# Patient Record
Sex: Female | Born: 1995 | Race: White | Hispanic: No | Marital: Single | Smoking: Never smoker
Health system: Southern US, Community
[De-identification: ages and names within clinical notes are randomized; demographics above are authoritative.]

## PROBLEM LIST (undated history)

## (undated) DIAGNOSIS — O210 Mild hyperemesis gravidarum: Secondary | ICD-10-CM

## (undated) DIAGNOSIS — R112 Nausea with vomiting, unspecified: Secondary | ICD-10-CM

## (undated) DIAGNOSIS — N289 Disorder of kidney and ureter, unspecified: Secondary | ICD-10-CM

## (undated) DIAGNOSIS — F1290 Cannabis use, unspecified, uncomplicated: Secondary | ICD-10-CM

## (undated) DIAGNOSIS — N200 Calculus of kidney: Secondary | ICD-10-CM

## (undated) DIAGNOSIS — B029 Zoster without complications: Secondary | ICD-10-CM

---
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
## 2019-01-29 ENCOUNTER — Encounter (HOSPITAL_BASED_OUTPATIENT_CLINIC_OR_DEPARTMENT_OTHER): Payer: Self-pay

## 2019-01-29 ENCOUNTER — Other Ambulatory Visit: Payer: Self-pay

## 2019-01-29 ENCOUNTER — Emergency Department (HOSPITAL_BASED_OUTPATIENT_CLINIC_OR_DEPARTMENT_OTHER): Payer: BC Managed Care – PPO

## 2019-01-29 ENCOUNTER — Emergency Department (HOSPITAL_BASED_OUTPATIENT_CLINIC_OR_DEPARTMENT_OTHER)
Admission: EM | Admit: 2019-01-29 | Discharge: 2019-01-29 | Disposition: A | Discharge status: Home / Self Care | Payer: BC Managed Care – PPO | Attending: Emergency Medicine | Admitting: Emergency Medicine

## 2019-01-29 DIAGNOSIS — N200 Calculus of kidney: Secondary | ICD-10-CM | POA: Insufficient documentation

## 2019-01-29 DIAGNOSIS — Z20822 Contact with and (suspected) exposure to covid-19: Secondary | ICD-10-CM | POA: Insufficient documentation

## 2019-01-29 DIAGNOSIS — R197 Diarrhea, unspecified: Secondary | ICD-10-CM | POA: Diagnosis present

## 2019-01-29 DIAGNOSIS — R1030 Lower abdominal pain, unspecified: Secondary | ICD-10-CM | POA: Diagnosis not present

## 2019-01-29 DIAGNOSIS — R112 Nausea with vomiting, unspecified: Secondary | ICD-10-CM | POA: Insufficient documentation

## 2019-01-29 HISTORY — DX: Zoster without complications: B02.9

## 2019-01-29 LAB — COMPREHENSIVE METABOLIC PANEL
ALT: 12 U/L (ref 0–44)
AST: 18 U/L (ref 15–41)
Albumin: 5.4 g/dL — ABNORMAL HIGH (ref 3.5–5.0)
Alkaline Phosphatase: 43 U/L (ref 38–126)
Anion gap: 16 — ABNORMAL HIGH (ref 5–15)
BUN: 35 mg/dL — ABNORMAL HIGH (ref 6–20)
CO2: 20 mmol/L — ABNORMAL LOW (ref 22–32)
Calcium: 10.1 mg/dL (ref 8.9–10.3)
Chloride: 100 mmol/L (ref 98–111)
Creatinine, Ser: 0.81 mg/dL (ref 0.44–1.00)
GFR calc Af Amer: >60 mL/min (ref >60)
GFR calc non Af Amer: >60 mL/min (ref >60)
Glucose, Bld: 99 mg/dL (ref 70–99)
Potassium: 3.8 mmol/L (ref 3.5–5.1)
Sodium: 136 mmol/L (ref 135–145)
Total Bilirubin: 1.4 mg/dL — ABNORMAL HIGH (ref 0.3–1.2)
Total Protein: 7.7 g/dL (ref 6.5–8.1)

## 2019-01-29 LAB — URINALYSIS, ROUTINE W REFLEX MICROSCOPIC
Glucose, UA: NEGATIVE mg/dL
Ketones, ur: >80 mg/dL — AB
Nitrite: NEGATIVE
Protein, ur: 100 mg/dL — AB
Specific Gravity, Urine: >1.03 — ABNORMAL HIGH (ref 1.005–1.030)
pH: 6 (ref 5.0–8.0)

## 2019-01-29 LAB — CBC WITH DIFFERENTIAL/PLATELET
Abs Immature Granulocytes: 0.05 10*3/uL (ref 0.00–0.07)
Basophils Absolute: 0.1 10*3/uL (ref 0.0–0.1)
Basophils Relative: 0 %
Eosinophils Absolute: 0 10*3/uL (ref 0.0–0.5)
Eosinophils Relative: 0 %
HCT: 42.4 % (ref 36.0–46.0)
Hemoglobin: 14.3 g/dL (ref 12.0–15.0)
Immature Granulocytes: 0 %
Lymphocytes Relative: 8 %
Lymphs Abs: 0.9 10*3/uL (ref 0.7–4.0)
MCH: 31.2 pg (ref 26.0–34.0)
MCHC: 33.7 g/dL (ref 30.0–36.0)
MCV: 92.6 fL (ref 80.0–100.0)
Monocytes Absolute: 0.7 10*3/uL (ref 0.1–1.0)
Monocytes Relative: 6 %
Neutro Abs: 10.1 10*3/uL — ABNORMAL HIGH (ref 1.7–7.7)
Neutrophils Relative %: 86 %
Platelets: 376 10*3/uL (ref 150–400)
RBC: 4.58 MIL/uL (ref 3.87–5.11)
RDW: 13.1 % (ref 11.5–15.5)
WBC: 11.8 10*3/uL — ABNORMAL HIGH (ref 4.0–10.5)
nRBC: 0 % (ref 0.0–0.2)

## 2019-01-29 LAB — URINALYSIS, MICROSCOPIC (REFLEX): RBC / HPF: >50 RBC/hpf (ref 0–5)

## 2019-01-29 LAB — SARS CORONAVIRUS 2 (TAT 6-24 HRS): SARS Coronavirus 2: NEGATIVE

## 2019-01-29 LAB — LIPASE, BLOOD: Lipase: 27 U/L (ref 11–51)

## 2019-01-29 LAB — PREGNANCY, URINE: Preg Test, Ur: NEGATIVE

## 2019-01-29 MED ORDER — METOCLOPRAMIDE HCL 5 MG/ML IJ SOLN
5.0000 mg | Freq: Once | INTRAMUSCULAR | Status: AC
  Administered 2019-01-29: 5 mg via INTRAVENOUS
  Filled 2019-01-29: qty 2

## 2019-01-29 MED ORDER — SODIUM CHLORIDE 0.9 % IV BOLUS (SEPSIS)
1000.0000 mL | Freq: Once | INTRAVENOUS | Status: AC
  Administered 2019-01-29: 1000 mL via INTRAVENOUS

## 2019-01-29 MED ORDER — ONDANSETRON HCL 4 MG/2ML IJ SOLN
4.0000 mg | Freq: Once | INTRAMUSCULAR | Status: AC
  Administered 2019-01-29: 4 mg via INTRAVENOUS
  Filled 2019-01-29: qty 2

## 2019-01-29 MED ORDER — KETOROLAC TROMETHAMINE 15 MG/ML IJ SOLN
15.0000 mg | Freq: Once | INTRAMUSCULAR | Status: AC
Start: 2019-01-29 — End: 2019-01-29
  Administered 2019-01-29: 15 mg via INTRAVENOUS
  Filled 2019-01-29: qty 1

## 2019-01-29 MED ORDER — ONDANSETRON HCL 4 MG PO TABS: 4.0000 mg | ORAL_TABLET | Freq: Four times a day (QID) | ORAL | 0 refills | Status: DC

## 2019-01-29 MED ORDER — PROMETHAZINE HCL 25 MG/ML IJ SOLN
INTRAMUSCULAR | Status: AC
  Filled 2019-01-29: qty 1

## 2019-01-29 MED ORDER — SODIUM CHLORIDE 0.9 % IV SOLN
1.0000 g | Freq: Once | INTRAVENOUS | Status: AC
  Administered 2019-01-29: 14:00:00 1 g via INTRAVENOUS
  Filled 2019-01-29: qty 10

## 2019-01-29 MED ORDER — PROMETHAZINE HCL 25 MG/ML IJ SOLN
6.2500 mg | Freq: Once | INTRAMUSCULAR | Status: AC
  Administered 2019-01-29: 6.25 mg via INTRAVENOUS

## 2019-01-29 MED ORDER — CEPHALEXIN 500 MG PO CAPS: 500.0000 mg | ORAL_CAPSULE | Freq: Two times a day (BID) | ORAL | 0 refills | Status: AC

## 2019-01-29 MED ORDER — SODIUM CHLORIDE 0.9 % IV SOLN: Freq: Once | INTRAVENOUS | Status: DC

## 2019-01-29 MED ORDER — TAMSULOSIN HCL 0.4 MG PO CAPS: 0.4000 mg | ORAL_CAPSULE | Freq: Every day | ORAL | 0 refills | Status: AC

## 2019-01-29 NOTE — ED Triage Notes (Signed)
Pt/o n/v/d x 3 days-NAD-steady gait

## 2019-01-29 NOTE — ED Notes (Signed)
Pt ambulated to RR unassisted 

## 2019-01-29 NOTE — ED Notes (Signed)
PO ginger ale given

## 2019-01-29 NOTE — ED Notes (Signed)
ED Provider at bedside. 

## 2019-01-29 NOTE — Discharge Instructions (Signed)
You were seen today because you are feeling unwell.  It appears that you have a kidney stone on the left side as well as a urinary tract infection.  We gave you fluids through an IV, medication for pain and nausea.  The stone is small enough that it should pass on its own.  If you have not felt better in 2 days please call the urology office that is listed on your paperwork.  If you have any new or worsening symptoms please return to the emergency department. Thank you for allowing me to care for you today. Please return to the emergency department if you have new or worsening symptoms. Take your medications as instructed.

## 2019-01-29 NOTE — ED Provider Notes (Signed)
[facility] EMERGENCY DEPARTMENT Provider Note   CSN: [id number] Arrival date & time: 01/29/19  1251     History Chief Complaint  Patient presents with  . Diarrhea    Tina Terry is a 24 y.o. female.  Patient is a 24 year old female with no past medical history presenting to the emergency department for 3 days of nausea, vomiting, diarrhea.  Reports occasional mild crampy abdominal pain.  Denies any dysuria, vaginal bleeding or vaginal discharge.  She reports trying to stay hydrated but is able to keep anything down due to the nausea and vomiting.  She denies any fever, cough.  She does say that she has been sneezing.  No known Covid exposure.        Past Medical History:  Diagnosis Date  . Shingles     There are no problems to display for this patient.   History reviewed. No pertinent surgical history.   OB History   No obstetric history on file.     No family history on file.  Social History   Tobacco Use  . Smoking status: Never Smoker  . Smokeless tobacco: Never Used  Substance Use Topics  . Alcohol use: Yes    Comment: occ  . Drug use: Yes    Types: Marijuana    Home Medications Prior to Admission medications   Medication Sig Start Date End Date Taking? Authorizing Provider  cephALEXin (KEFLEX) 500 MG capsule Take 1 capsule (500 mg total) by mouth 2 (two) times daily for 7 days. 01/29/19 02/05/19  Arlyn Dunning, PA-C  HYDROcodone-acetaminophen (NORCO) 5-325 MG per tablet Take 1 tablet by mouth every 4 (four) hours as needed for pain. 06/05/12   Loren Racer, MD  ondansetron (ZOFRAN) 4 MG tablet Take 1 tablet (4 mg total) by mouth every 6 (six) hours. 01/29/19   Arlyn Dunning, PA-C  tamsulosin (FLOMAX) 0.4 MG CAPS capsule Take 1 capsule (0.4 mg total) by mouth daily after supper for 5 days. 01/29/19 02/03/19  Arlyn Dunning, PA-C    Allergies    Patient has no known allergies.  Review of Systems   Review of Systems  Constitutional:  Positive for appetite change and fatigue. Negative for chills and fever.  HENT: Positive for sneezing. Negative for congestion and sore throat.   Respiratory: Negative for cough and shortness of breath.   Cardiovascular: Negative for chest pain.  Gastrointestinal: Positive for abdominal pain, diarrhea, nausea and vomiting. Negative for constipation.  Genitourinary: Negative for dysuria, flank pain, menstrual problem, pelvic pain, vaginal bleeding and vaginal discharge.  Musculoskeletal: Negative for back pain.  Skin: Negative for rash.  Neurological: Negative for dizziness, light-headedness and headaches.    Physical Exam Updated Vital Signs BP 120/83 (BP Location: Right Arm)   Pulse 80   Temp 98.8 F (37.1 C) (Oral)   Resp 14   Ht 5\' 1"  (1.549 m)   Wt 49.4 kg   LMP 12/27/2018   SpO2 100%   BMI 20.60 kg/m   Physical Exam Vitals and nursing note reviewed.  Constitutional:      General: She is not in acute distress.    Appearance: Normal appearance. She is not ill-appearing, toxic-appearing or diaphoretic.  HENT:     Head: Normocephalic.     Nose: Nose normal.     Mouth/Throat:     Mouth: Mucous membranes are moist.  Eyes:     Conjunctiva/sclera: Conjunctivae normal.  Cardiovascular:     Rate and Rhythm: Normal rate  and regular rhythm.     Pulses: Normal pulses.  Pulmonary:     Effort: Pulmonary effort is normal.     Breath sounds: Normal breath sounds.  Abdominal:     General: Abdomen is flat. Bowel sounds are normal.     Tenderness: There is no abdominal tenderness. There is no right CVA tenderness, left CVA tenderness, guarding or rebound.  Skin:    General: Skin is warm and dry.  Neurological:     General: No focal deficit present.     Mental Status: She is alert.  Psychiatric:        Mood and Affect: Mood normal.     ED Results / Procedures / Treatments   Labs (all labs ordered are listed, but only abnormal results are displayed) Labs Reviewed  CBC WITH  DIFFERENTIAL/PLATELET - Abnormal; Notable for the following components:      Result Value   WBC 11.8 (*)    Neutro Abs 10.1 (*)    All other components within normal limits  COMPREHENSIVE METABOLIC PANEL - Abnormal; Notable for the following components:   CO2 20 (*)    BUN 35 (*)    Albumin 5.4 (*)    Total Bilirubin 1.4 (*)    Anion gap 16 (*)    All other components within normal limits  URINALYSIS, ROUTINE W REFLEX MICROSCOPIC - Abnormal; Notable for the following components:   APPearance CLOUDY (*)    Specific Gravity, Urine >1.030 (*)    Hgb urine dipstick SMALL (*)    Bilirubin Urine MODERATE (*)    Ketones, ur >80 (*)    Protein, ur 100 (*)    Leukocytes,Ua TRACE (*)    All other components within normal limits  URINALYSIS, MICROSCOPIC (REFLEX) - Abnormal; Notable for the following components:   Bacteria, UA MANY (*)    All other components within normal limits  SARS CORONAVIRUS 2 (TAT 6-24 HRS)  URINE CULTURE  PREGNANCY, URINE  LIPASE, BLOOD    EKG None  Radiology CT Renal Stone Study  Result Date: 01/29/2019 CLINICAL DATA:  Hematuria EXAM: CT ABDOMEN AND PELVIS WITHOUT CONTRAST TECHNIQUE: Multidetector CT imaging of the abdomen and pelvis was performed following the standard protocol without IV contrast. COMPARISON:  None. FINDINGS: Lower chest: No acute abnormality. Hepatobiliary: 12 mm geographic area of decreased attenuation adjacent to the falciform ligament suggesting focal fatty infiltration. Liver is otherwise unremarkable. Gallbladder appears unremarkable without gallstone or biliary dilatation. Pancreas: Unremarkable. No pancreatic ductal dilatation or surrounding inflammatory changes. Spleen: Normal in size without focal abnormality. Adrenals/Urinary Tract: Adrenal glands are unremarkable. No calculus or hydronephrosis of the bilateral kidneys. There is a tiny 1-2 mm punctate calculus within the left hemipelvis (series 2, image 64) which could possibly be  located within the distal left ureter which is not well visualized at this level. The visualized portions of the bilateral ureters are nondilated. Urinary bladder is unremarkable for the degree of distension. Stomach/Bowel: Stomach is within normal limits. Appendix appears normal. No evidence of bowel wall thickening, distention, or inflammatory changes. Vascular/Lymphatic: No significant vascular findings are present. No enlarged abdominal or pelvic lymph nodes. Reproductive: Unremarkable uterus. Multiple small cysts versus follicles in the bilateral ovaries. Other: Small volume free fluid within the posterior cul-de-sac. Musculoskeletal: No acute or significant osseous findings. IMPRESSION: 1. There is a tiny 1-2 mm punctate calculus within the left hemipelvis which could possibly be located within the distal left ureter which is not well visualized at this level. Left  ureter is not dilated. Otherwise, no evidence of obstructive uropathy. 2. Small volume free fluid within the posterior cul-de-sac, which may be physiologic. 3. Otherwise, no acute abdominopelvic findings. Electronically Signed   By: Duanne Guess D.O.   On: 01/29/2019 15:40    Procedures Procedures (including critical care time)  Medications Ordered in ED Medications  ondansetron (ZOFRAN) injection 4 mg (4 mg Intravenous Given 01/29/19 1331)  sodium chloride 0.9 % bolus 1,000 mL (0 mLs Intravenous Stopped 01/29/19 1431)  promethazine (PHENERGAN) injection 6.25 mg (6.25 mg Intravenous Given 01/29/19 1415)  cefTRIAXone (ROCEPHIN) 1 g in sodium chloride 0.9 % 100 mL IVPB ( Intravenous Stopped 01/29/19 1500)  metoCLOPramide (REGLAN) injection 5 mg (5 mg Intravenous Given 01/29/19 1603)  ketorolac (TORADOL) 15 MG/ML injection 15 mg (15 mg Intravenous Given 01/29/19 1603)    ED Course  I have reviewed the triage vital signs and the nursing notes.  Pertinent labs & imaging results that were available during my care of the patient were  reviewed by me and considered in my medical decision making (see chart for details).  Clinical Course as of Jan 29 1624  Wed Jan 29, 2019  1418 Patient with n/v/d for 3 days. Denies dysuria, vaginal bleeding, vaginal discharge, pelvic pain or concern for STD. Patient with a soft and nontender abdominal exam. She does have urine concerning for hemorrhagic cystitis. Denies gross hematuria. No flank pain or CVA tenderness to suggest kidney stone. However, given her urine appearance, and continued vomiting, concern for kidney stone so will obtain stone.  Patient will be given IV antiemetic, fluids and rocephin. Will reassess   [KM]  1555 Patient does have 1-2 mm punctate calculus. She is tolerating PO. Normal kidney function. Agrees with d/c home and f/u as needed or return if new or worsening symptoms   [KM]    Clinical Course User Index [KM] Jeral Pinch   MDM Rules/Calculators/A&P                      Based on review of vitals, medical screening exam, lab work and/or imaging, there does not appear to be an acute, emergent etiology for the patient's symptoms. Counseled pt on good return precautions and encouraged both PCP and ED follow-up as needed.  Prior to discharge, I also discussed incidental imaging findings with patient in detail and advised appropriate, recommended follow-up in detail.  Clinical Impression: 1. Kidney stone     Disposition: Discharge  Prior to providing a prescription for a controlled substance, I independently reviewed the patient's recent prescription history on the [place] Controlled Substance Reporting System. The patient had no recent or regular prescriptions and was deemed appropriate for a brief, less than 3 day prescription of narcotic for acute analgesia.  This note was prepared with assistance of Conservation officer, historic buildings. Occasional wrong-word or sound-a-like substitutions may have occurred due to the inherent limitations of voice  recognition software.  Final Clinical Impression(s) / ED Diagnoses Final diagnoses:  Kidney stone    Rx / DC Orders ED Discharge Orders         Ordered    cephALEXin (KEFLEX) 500 MG capsule  2 times daily     01/29/19 1625    tamsulosin (FLOMAX) 0.4 MG CAPS capsule  Daily after supper     01/29/19 1625    ondansetron (ZOFRAN) 4 MG tablet  Every 6 hours     01/29/19 1625  Kristine Royal 01/29/19 1626    Hayden Rasmussen, MD 01/29/19 [phone]

## 2019-01-30 LAB — URINE CULTURE: Culture: NO GROWTH

## 2019-05-22 ENCOUNTER — Other Ambulatory Visit: Payer: Self-pay

## 2019-05-22 ENCOUNTER — Encounter (HOSPITAL_BASED_OUTPATIENT_CLINIC_OR_DEPARTMENT_OTHER): Payer: Self-pay

## 2019-05-22 ENCOUNTER — Emergency Department (HOSPITAL_BASED_OUTPATIENT_CLINIC_OR_DEPARTMENT_OTHER)
Admission: EM | Admit: 2019-05-22 | Discharge: 2019-05-22 | Disposition: A | Discharge status: Home / Self Care | Payer: BC Managed Care – PPO | Attending: Emergency Medicine | Admitting: Emergency Medicine

## 2019-05-22 DIAGNOSIS — R197 Diarrhea, unspecified: Secondary | ICD-10-CM

## 2019-05-22 DIAGNOSIS — N390 Urinary tract infection, site not specified: Secondary | ICD-10-CM

## 2019-05-22 DIAGNOSIS — N3000 Acute cystitis without hematuria: Secondary | ICD-10-CM | POA: Diagnosis not present

## 2019-05-22 DIAGNOSIS — R112 Nausea with vomiting, unspecified: Secondary | ICD-10-CM | POA: Diagnosis present

## 2019-05-22 LAB — URINALYSIS, MICROSCOPIC (REFLEX)

## 2019-05-22 LAB — URINALYSIS, ROUTINE W REFLEX MICROSCOPIC
Glucose, UA: NEGATIVE mg/dL
Ketones, ur: 40 mg/dL — AB
Leukocytes,Ua: NEGATIVE
Nitrite: NEGATIVE
Protein, ur: >300 mg/dL — AB
Specific Gravity, Urine: >1.03 — ABNORMAL HIGH (ref 1.005–1.030)
pH: 6 (ref 5.0–8.0)

## 2019-05-22 LAB — PREGNANCY, URINE: Preg Test, Ur: NEGATIVE

## 2019-05-22 MED ORDER — LOPERAMIDE HCL 2 MG PO CAPS
4.0000 mg | ORAL_CAPSULE | Freq: Once | ORAL | Status: AC
  Administered 2019-05-22: 4 mg via ORAL
  Filled 2019-05-22: qty 2

## 2019-05-22 MED ORDER — CEPHALEXIN 500 MG PO CAPS
500.0000 mg | ORAL_CAPSULE | Freq: Three times a day (TID) | ORAL | 0 refills | Status: DC
Start: 2019-05-22 — End: 2019-06-01

## 2019-05-22 MED ORDER — SODIUM CHLORIDE 0.9 % IV SOLN
1.0000 g | Freq: Once | INTRAVENOUS | Status: AC
  Administered 2019-05-22: 1 g via INTRAVENOUS
  Filled 2019-05-22: qty 10

## 2019-05-22 MED ORDER — ONDANSETRON HCL 4 MG/2ML IJ SOLN
INTRAMUSCULAR | Status: AC
  Filled 2019-05-22: qty 2

## 2019-05-22 MED ORDER — ONDANSETRON HCL 4 MG/2ML IJ SOLN
4.0000 mg | Freq: Once | INTRAMUSCULAR | Status: AC
Start: 2019-05-22 — End: 2019-05-22
  Administered 2019-05-22: 4 mg via INTRAVENOUS

## 2019-05-22 MED ORDER — SODIUM CHLORIDE 0.9 % IV BOLUS
1000.0000 mL | Freq: Once | INTRAVENOUS | Status: AC
  Administered 2019-05-22: 17:00:00 1000 mL via INTRAVENOUS

## 2019-05-22 MED ORDER — ONDANSETRON HCL 4 MG PO TABS
4.0000 mg | ORAL_TABLET | Freq: Four times a day (QID) | ORAL | 0 refills | Status: DC | PRN
Start: 2019-05-22 — End: 2019-06-01

## 2019-05-22 NOTE — ED Notes (Signed)
ED Provider at bedside. 

## 2019-05-22 NOTE — ED Triage Notes (Signed)
C/o n/v/d x 3 days-denies fever-pt seen at [facility] and [facility] at [facility] ED-NAD-steady gait

## 2019-05-23 LAB — URINE CULTURE: Culture: <10000 — AB

## 2019-05-26 NOTE — ED Provider Notes (Signed)
[facility] EMERGENCY DEPARTMENT Provider Note   CSN: [id number] Arrival date & time: 05/22/19  1528     History Chief Complaint  Patient presents with  . Diarrhea    Tina Terry is a 24 y.o. female.  HPI   24 year old female with a history of nausea/vomiting/diarrhea.  Onset about 3 days ago.  Diffuse crampy abdominal pain.  Really no focality.  Patient went to [facility] earlier today but left without being seen because of the weight.  No fever.  No sick contacts he is aware of.  Past Medical History:  Diagnosis Date  . Shingles     There are no problems to display for this patient.   History reviewed. No pertinent surgical history.   OB History   No obstetric history on file.     No family history on file.  Social History   Tobacco Use  . Smoking status: Never Smoker  . Smokeless tobacco: Never Used  Substance Use Topics  . Alcohol use: Yes    Comment: occ  . Drug use: Yes    Types: Marijuana    Home Medications Prior to Admission medications   Medication Sig Start Date End Date Taking? Authorizing Provider  cephALEXin (KEFLEX) 500 MG capsule Take 1 capsule (500 mg total) by mouth 3 (three) times daily. 05/22/19   Raeford Razor, MD  ondansetron (ZOFRAN) 4 MG tablet Take 1 tablet (4 mg total) by mouth every 6 (six) hours as needed for nausea or vomiting. 05/22/19   Raeford Razor, MD    Allergies    Patient has no known allergies.  Review of Systems   Review of Systems All systems reviewed and negative, other than as noted in HPI.  Physical Exam Updated Vital Signs BP 105/70 (BP Location: Right Arm)   Pulse 75   Temp 98.5 F (36.9 C) (Oral)   Resp 16   Ht 5' 1.1" (1.552 m)   Wt 48.6 kg   LMP 05/19/2019   SpO2 99%   BMI 20.17 kg/m   Physical Exam Vitals and nursing note reviewed.  Constitutional:      General: She is not in acute distress.    Appearance: She is well-developed.  HENT:     Head: Normocephalic and  atraumatic.  Eyes:     General:        Right eye: No discharge.        Left eye: No discharge.     Conjunctiva/sclera: Conjunctivae normal.  Cardiovascular:     Rate and Rhythm: Normal rate and regular rhythm.     Heart sounds: Normal heart sounds. No murmur. No friction rub. No gallop.   Pulmonary:     Effort: Pulmonary effort is normal. No respiratory distress.     Breath sounds: Normal breath sounds.  Abdominal:     General: There is no distension.     Palpations: Abdomen is soft.     Tenderness: There is no abdominal tenderness.  Musculoskeletal:        General: No tenderness.     Cervical back: Neck supple.  Skin:    General: Skin is warm and dry.  Neurological:     Mental Status: She is alert.  Psychiatric:        Behavior: Behavior normal.        Thought Content: Thought content normal.     ED Results / Procedures / Treatments   Labs (all labs ordered are listed, but only abnormal results are  displayed) Labs Reviewed  URINE CULTURE - Abnormal; Notable for the following components:      Result Value   Culture   (*)    Value: <10,000 COLONIES/mL INSIGNIFICANT GROWTH Performed at [facility], [place] [address], [place], [place] [address]    All other components within normal limits  URINALYSIS, ROUTINE W REFLEX MICROSCOPIC - Abnormal; Notable for the following components:   APPearance CLOUDY (*)    Specific Gravity, Urine >1.030 (*)    Hgb urine dipstick LARGE (*)    Bilirubin Urine MODERATE (*)    Ketones, ur 40 (*)    Protein, ur >300 (*)    All other components within normal limits  URINALYSIS, MICROSCOPIC (REFLEX) - Abnormal; Notable for the following components:   Bacteria, UA MANY (*)    All other components within normal limits  PREGNANCY, URINE    EKG None  Radiology No results found.  Procedures Procedures (including critical care time)  Medications Ordered in ED Medications  sodium chloride 0.9 % bolus 1,000 mL ( Intravenous Stopped  05/22/19 1751)  ondansetron (ZOFRAN) injection 4 mg (4 mg Intravenous Given 05/22/19 1651)  cefTRIAXone (ROCEPHIN) 1 g in sodium chloride 0.9 % 100 mL IVPB (0 g Intravenous Stopped 05/22/19 1738)  loperamide (IMODIUM) capsule 4 mg (4 mg Oral Given 05/22/19 1704)    ED Course  I have reviewed the triage vital signs and the nursing notes.  Pertinent labs & imaging results that were available during my care of the patient were reviewed by me and considered in my medical decision making (see chart for details).    MDM Rules/Calculators/A&P                      Final Clinical Impression(s) / ED Diagnoses Final diagnoses:  Nausea vomiting and diarrhea  Urinary tract infection without hematuria, site unspecified    Rx / DC Orders ED Discharge Orders         Ordered    cephALEXin (KEFLEX) 500 MG capsule  3 times daily     05/22/19 1942    ondansetron (ZOFRAN) 4 MG tablet  Every 6 hours PRN     05/22/19 1943           Virgel Manifold, MD 05/26/19 1258

## 2019-05-31 ENCOUNTER — Encounter (HOSPITAL_BASED_OUTPATIENT_CLINIC_OR_DEPARTMENT_OTHER): Payer: Self-pay | Admitting: Emergency Medicine

## 2019-05-31 ENCOUNTER — Other Ambulatory Visit: Payer: Self-pay

## 2019-05-31 DIAGNOSIS — R197 Diarrhea, unspecified: Secondary | ICD-10-CM | POA: Insufficient documentation

## 2019-05-31 DIAGNOSIS — F12188 Cannabis abuse with other cannabis-induced disorder: Secondary | ICD-10-CM | POA: Diagnosis not present

## 2019-05-31 DIAGNOSIS — E860 Dehydration: Secondary | ICD-10-CM | POA: Diagnosis not present

## 2019-05-31 DIAGNOSIS — R112 Nausea with vomiting, unspecified: Secondary | ICD-10-CM | POA: Diagnosis present

## 2019-05-31 LAB — URINALYSIS, ROUTINE W REFLEX MICROSCOPIC
Glucose, UA: NEGATIVE mg/dL
Ketones, ur: >80 mg/dL — AB
Leukocytes,Ua: NEGATIVE
Nitrite: NEGATIVE
Protein, ur: 30 mg/dL — AB
Specific Gravity, Urine: >1.03 — ABNORMAL HIGH (ref 1.005–1.030)
pH: 6 (ref 5.0–8.0)

## 2019-05-31 LAB — URINALYSIS, MICROSCOPIC (REFLEX)

## 2019-05-31 LAB — PREGNANCY, URINE: Preg Test, Ur: NEGATIVE

## 2019-05-31 MED ORDER — ONDANSETRON 4 MG PO TBDP
ORAL_TABLET | ORAL | Status: AC
  Administered 2019-05-31: 4 mg via ORAL
  Filled 2019-05-31: qty 1

## 2019-05-31 MED ORDER — ONDANSETRON 4 MG PO TBDP: 4.0000 mg | ORAL_TABLET | Freq: Once | ORAL | Status: AC

## 2019-05-31 NOTE — ED Triage Notes (Signed)
C/o N/v/d, chills and cough since Thursday. Pt seen here "for same" 05/22/19. She states she was then dx with UTI and given abx, initially seemed to recover but sx returned. Denies dysuria, hematuria or other urinary sx.

## 2019-06-01 ENCOUNTER — Telehealth (HOSPITAL_BASED_OUTPATIENT_CLINIC_OR_DEPARTMENT_OTHER): Payer: Self-pay | Admitting: Emergency Medicine

## 2019-06-01 ENCOUNTER — Emergency Department (HOSPITAL_BASED_OUTPATIENT_CLINIC_OR_DEPARTMENT_OTHER)
Admission: EM | Admit: 2019-06-01 | Discharge: 2019-06-01 | Disposition: A | Discharge status: Home / Self Care | Payer: BC Managed Care – PPO | Attending: Emergency Medicine | Admitting: Emergency Medicine

## 2019-06-01 DIAGNOSIS — E860 Dehydration: Secondary | ICD-10-CM

## 2019-06-01 DIAGNOSIS — F1290 Cannabis use, unspecified, uncomplicated: Secondary | ICD-10-CM

## 2019-06-01 HISTORY — DX: Disorder of kidney and ureter, unspecified: N28.9

## 2019-06-01 LAB — RAPID URINE DRUG SCREEN, HOSP PERFORMED
Amphetamines: NOT DETECTED
Barbiturates: NOT DETECTED
Benzodiazepines: NOT DETECTED
Cocaine: NOT DETECTED
Opiates: NOT DETECTED
Tetrahydrocannabinol: POSITIVE — AB

## 2019-06-01 MED ORDER — SODIUM CHLORIDE 0.9 % IV BOLUS
1000.0000 mL | Freq: Once | INTRAVENOUS | Status: AC
  Administered 2019-06-01: 1000 mL via INTRAVENOUS

## 2019-06-01 MED ORDER — METOCLOPRAMIDE HCL 10 MG PO TABS
10.0000 mg | ORAL_TABLET | Freq: Four times a day (QID) | ORAL | 0 refills | Status: DC | PRN
Start: 2019-06-01 — End: 2020-07-05

## 2019-06-01 MED ORDER — SODIUM CHLORIDE 0.9 % IV BOLUS: 1000.0000 mL | Freq: Once | INTRAVENOUS | Status: DC

## 2019-06-01 MED ORDER — PROMETHAZINE HCL 25 MG/ML IJ SOLN: 12.5000 mg | Freq: Once | INTRAMUSCULAR | Status: DC

## 2019-06-01 MED ORDER — HALOPERIDOL LACTATE 5 MG/ML IJ SOLN
2.0000 mg | Freq: Once | INTRAMUSCULAR | Status: AC
  Administered 2019-06-01: 2 mg via INTRAVENOUS
  Filled 2019-06-01: qty 1

## 2019-06-01 NOTE — ED Provider Notes (Signed)
[facility] Provider Note: Lowella Dell, MD, FACEP  CSN: [id number] MRN: [id number] ARRIVAL: 05/31/19 at 1924 ROOM: MH10/MH10   CHIEF COMPLAINT  Vomiting   HISTORY OF PRESENT ILLNESS  06/01/19 12:42 AM Tina Terry is a 24 y.o. female with nausea, vomiting and diarrhea for the past 3 days.  The vomiting has been severe and she feels dehydrated.  Any attempt to eat or drink causes her to vomit.  She has associated epigastric abdominal pain which she rates as a 6 out of 10, cramping in nature.  The abdominal pain is not as severe as her vomiting.  She was recently treated for urinary tract infection but denies urinary symptoms currently.  She was given Zofran 4 mg ODT prior to my evaluation without relief of her nausea.  She has vomited twice since.  She does admit to marijuana use.  She brought a hand written summary of her recent medical history:       Past Medical History:  Diagnosis Date  . Renal disorder    kidney stone  . Shingles     History reviewed. No pertinent surgical history.  No family history on file.  Social History   Tobacco Use  . Smoking status: Never Smoker  . Smokeless tobacco: Never Used  Substance Use Topics  . Alcohol use: Yes    Comment: occ  . Drug use: Yes    Types: Marijuana    Comment: every day smoker    Prior to Admission medications   Medication Sig Start Date End Date Taking? Authorizing Provider  metoCLOPramide (REGLAN) 10 MG tablet Take 1 tablet (10 mg total) by mouth every 6 (six) hours as needed for nausea or vomiting. 06/01/19   Kamaree Berkel, Jonny Ruiz, MD    Allergies Patient has no known allergies.   REVIEW OF SYSTEMS  Negative except as noted here or in the History of Present Illness.   PHYSICAL EXAMINATION  Initial Vital Signs Blood pressure 123/78, pulse 75, temperature 99.4 F (37.4 C), temperature source Oral, resp. rate 20, height 5\' 1"  (1.549 m), weight 48.5 kg, last menstrual period 05/19/2019, SpO2 100  %.  Examination General: Well-developed, thin female in no acute distress; appearance consistent with age of record HENT: normocephalic; atraumatic Eyes: pupils equal, round and reactive to light; extraocular muscles intact Neck: supple Heart: regular rate and rhythm Lungs: clear to auscultation bilaterally Abdomen: soft; nondistended; mild diffuse tenderness; no masses or hepatosplenomegaly; bowel sounds present Extremities: No deformity; full range of motion; pulses normal Neurologic: Awake, alert and oriented; motor function intact in all extremities and symmetric; no facial droop Skin: Warm and dry Psychiatric: Flat affect   RESULTS  Summary of this visit's results, reviewed and interpreted by myself:   EKG Interpretation  Date/Time:    Ventricular Rate:    PR Interval:    QRS Duration:   QT Interval:    QTC Calculation:   R Axis:     Text Interpretation:        Laboratory Studies: Results for orders placed or performed during the hospital encounter of 06/01/19 (from the past 24 hour(s))  Urinalysis, Routine w reflex microscopic     Status: Abnormal   Collection Time: 05/31/19  8:10 PM  Result Value Ref Range   Color, Urine YELLOW YELLOW   APPearance CLEAR CLEAR   Specific Gravity, Urine >1.030 (H) 1.005 - 1.030   pH 6.0 5.0 - 8.0   Glucose, UA NEGATIVE NEGATIVE mg/dL   Hgb urine dipstick  TRACE (A) NEGATIVE   Bilirubin Urine SMALL (A) NEGATIVE   Ketones, ur >80 (A) NEGATIVE mg/dL   Protein, ur 30 (A) NEGATIVE mg/dL   Nitrite NEGATIVE NEGATIVE   Leukocytes,Ua NEGATIVE NEGATIVE  Pregnancy, urine     Status: None   Collection Time: 05/31/19  8:10 PM  Result Value Ref Range   Preg Test, Ur NEGATIVE NEGATIVE  Urinalysis, Microscopic (reflex)     Status: Abnormal   Collection Time: 05/31/19  8:10 PM  Result Value Ref Range   RBC / HPF 6-10 0 - 5 RBC/hpf   WBC, UA 0-5 0 - 5 WBC/hpf   Bacteria, UA FEW (A) NONE SEEN   Squamous Epithelial / LPF 11-20 0 - 5   Mucus  PRESENT   Rapid urine drug screen (hospital performed)     Status: Abnormal   Collection Time: 05/31/19  8:10 PM  Result Value Ref Range   Opiates NONE DETECTED NONE DETECTED   Cocaine NONE DETECTED NONE DETECTED   Benzodiazepines NONE DETECTED NONE DETECTED   Amphetamines NONE DETECTED NONE DETECTED   Tetrahydrocannabinol POSITIVE (A) NONE DETECTED   Barbiturates NONE DETECTED NONE DETECTED   Imaging Studies: No results found.  ED COURSE and MDM  Nursing notes, initial and subsequent vitals signs, including pulse oximetry, reviewed and interpreted by myself.  Vitals:   05/31/19 2002 05/31/19 2003 06/01/19 0030  BP: 131/90  123/78  Pulse: 90  75  Resp: 19  20  Temp: 99.4 F (37.4 C)    TempSrc: Oral    SpO2: 98%  100%  Weight:  48.5 kg   Height:  5\' 1"  (1.549 m)    Medications  sodium chloride 0.9 % bolus 1,000 mL (has no administration in time range)  ondansetron (ZOFRAN-ODT) disintegrating tablet 4 mg (4 mg Oral Given 05/31/19 2012)  sodium chloride 0.9 % bolus 1,000 mL (1,000 mLs Intravenous New Bag/Given 06/01/19 0112)  haloperidol lactate (HALDOL) injection 2 mg (2 mg Intravenous Given 06/01/19 0109)   2:26 AM Patient now able to drink fluids without vomiting after Haldol 2 mg IV.  History consistent with cannabis associated hyperemesis syndrome.  Patient advised that this may continue recurring until she stops using cannabis altogether.   PROCEDURES  Procedures   ED DIAGNOSES     ICD-10-CM   1. Cannabinoid hyperemesis syndrome  R11.2    F12.90   2. Dehydration  E86.0        Armonee Bojanowski, Jenny Reichmann, MD 06/01/19 [phone]

## 2019-06-01 NOTE — ED Notes (Signed)
Lab called to add on UDS.

## 2019-06-01 NOTE — ED Notes (Signed)
Pt states she feels better and is ready for discharge

## 2019-06-01 NOTE — ED Notes (Signed)
Fluid challenge given per EDP instruction.

## 2019-06-06 HISTORY — PX: ESOPHAGOGASTRODUODENOSCOPY: SHX1529

## 2020-01-17 NOTE — L&D Delivery Note (Signed)
[facility] Delivery Note  Tina Terry is a 25 y.o. G1P0 at [redacted]w[redacted]d admitted for SROM.   GBS Status: Negative/-- (12/13 1301) Maximum Maternal Temperature: 99.0  Labor course: Initial SVE: 1 cm. Augmentation with: AROM. She then progressed to complete.  ROM: 19h 62m with clear fluid  Birth: At 1558 a viable female was delivered via spontaneous vaginal delivery (Presentation: ROA). Nuchal cord present: Yes. Compound right hand. Shoulders and body delivered in usual fashion. Infant placed directly on mom's abdomen for bonding/skin-to-skin, baby dried and stimulated. Cord clamped x 2 after 1 minute and cut by FOB.  Cord blood collected.  The placenta separated spontaneously and delivered via gentle cord traction.  Pitocin infused rapidly IV per protocol.  Fundus firm with massage.  Placenta inspected and appears to be intact with a 3 VC.  Placenta/Cord with the following complications: none.  Cord pH: n/a Sponge and instrument count were correct x2.  Intrapartum complications:  None Anesthesia:  epidural Episiotomy: none Lacerations:  none Suture Repair:  n/a EBL (mL): 75   Infant: APGAR (1 MIN): 9   APGAR (5 MINS): 9   Infant weight: pending  Mom to postpartum.  Baby to Couplet care / Skin to Skin. Placenta to L&D   Plans to Breastfeed Contraception:  unsure Circumcision: wants inpatient  Note sent to [facility]: [facility] for pp visit.  Raelyn Mora , MSN, CNM 01/03/2021 4:29 PM

## 2020-05-27 ENCOUNTER — Emergency Department (HOSPITAL_BASED_OUTPATIENT_CLINIC_OR_DEPARTMENT_OTHER)
Admission: EM | Admit: 2020-05-27 | Discharge: 2020-05-27 | Disposition: A | Discharge status: Home / Self Care | Payer: BC Managed Care – PPO | Attending: Emergency Medicine | Admitting: Emergency Medicine

## 2020-05-27 ENCOUNTER — Encounter (HOSPITAL_BASED_OUTPATIENT_CLINIC_OR_DEPARTMENT_OTHER): Payer: Self-pay | Admitting: *Deleted

## 2020-05-27 ENCOUNTER — Other Ambulatory Visit: Payer: Self-pay

## 2020-05-27 DIAGNOSIS — O219 Vomiting of pregnancy, unspecified: Secondary | ICD-10-CM | POA: Insufficient documentation

## 2020-05-27 DIAGNOSIS — Z3A01 Less than 8 weeks gestation of pregnancy: Secondary | ICD-10-CM | POA: Insufficient documentation

## 2020-05-27 DIAGNOSIS — R8271 Bacteriuria: Secondary | ICD-10-CM

## 2020-05-27 DIAGNOSIS — O210 Mild hyperemesis gravidarum: Secondary | ICD-10-CM

## 2020-05-27 DIAGNOSIS — Z3491 Encounter for supervision of normal pregnancy, unspecified, first trimester: Secondary | ICD-10-CM

## 2020-05-27 LAB — CBC WITH DIFFERENTIAL/PLATELET
Abs Immature Granulocytes: 0.07 10*3/uL (ref 0.00–0.07)
Basophils Absolute: 0.1 10*3/uL (ref 0.0–0.1)
Basophils Relative: 1 %
Eosinophils Absolute: 0 10*3/uL (ref 0.0–0.5)
Eosinophils Relative: 0 %
HCT: 40.1 % (ref 36.0–46.0)
Hemoglobin: 13.9 g/dL (ref 12.0–15.0)
Immature Granulocytes: 1 %
Lymphocytes Relative: 6 %
Lymphs Abs: 0.9 10*3/uL (ref 0.7–4.0)
MCH: 31.2 pg (ref 26.0–34.0)
MCHC: 34.7 g/dL (ref 30.0–36.0)
MCV: 89.9 fL (ref 80.0–100.0)
Monocytes Absolute: 0.4 10*3/uL (ref 0.1–1.0)
Monocytes Relative: 3 %
Neutro Abs: 12.7 10*3/uL — ABNORMAL HIGH (ref 1.7–7.7)
Neutrophils Relative %: 89 %
Platelets: 441 10*3/uL — ABNORMAL HIGH (ref 150–400)
RBC: 4.46 MIL/uL (ref 3.87–5.11)
RDW: 12.7 % (ref 11.5–15.5)
WBC: 14.1 10*3/uL — ABNORMAL HIGH (ref 4.0–10.5)
nRBC: 0 % (ref 0.0–0.2)

## 2020-05-27 LAB — URINALYSIS, ROUTINE W REFLEX MICROSCOPIC
Bilirubin Urine: NEGATIVE
Glucose, UA: NEGATIVE mg/dL
Hgb urine dipstick: NEGATIVE
Ketones, ur: >80 mg/dL — AB
Leukocytes,Ua: NEGATIVE
Nitrite: NEGATIVE
Protein, ur: 30 mg/dL — AB
Specific Gravity, Urine: >1.03 — ABNORMAL HIGH (ref 1.005–1.030)
pH: 6 (ref 5.0–8.0)

## 2020-05-27 LAB — RAPID URINE DRUG SCREEN, HOSP PERFORMED
Amphetamines: NOT DETECTED
Barbiturates: NOT DETECTED
Benzodiazepines: NOT DETECTED
Cocaine: NOT DETECTED
Opiates: NOT DETECTED
Tetrahydrocannabinol: POSITIVE — AB

## 2020-05-27 LAB — COMPREHENSIVE METABOLIC PANEL
ALT: 17 U/L (ref 0–44)
AST: 21 U/L (ref 15–41)
Albumin: 4.9 g/dL (ref 3.5–5.0)
Alkaline Phosphatase: 39 U/L (ref 38–126)
Anion gap: 15 (ref 5–15)
BUN: 18 mg/dL (ref 6–20)
CO2: 19 mmol/L — ABNORMAL LOW (ref 22–32)
Calcium: 9.5 mg/dL (ref 8.9–10.3)
Chloride: 103 mmol/L (ref 98–111)
Creatinine, Ser: 0.68 mg/dL (ref 0.44–1.00)
GFR, Estimated: >60 mL/min (ref >60)
Glucose, Bld: 106 mg/dL — ABNORMAL HIGH (ref 70–99)
Potassium: 3.5 mmol/L (ref 3.5–5.1)
Sodium: 137 mmol/L (ref 135–145)
Total Bilirubin: 1.1 mg/dL (ref 0.3–1.2)
Total Protein: 8 g/dL (ref 6.5–8.1)

## 2020-05-27 LAB — HCG, QUANTITATIVE, PREGNANCY: hCG, Beta Chain, Quant, S: 31658 m[IU]/mL — ABNORMAL HIGH (ref <5)

## 2020-05-27 LAB — URINALYSIS, MICROSCOPIC (REFLEX)

## 2020-05-27 LAB — LIPASE, BLOOD: Lipase: 27 U/L (ref 11–51)

## 2020-05-27 LAB — PREGNANCY, URINE: Preg Test, Ur: POSITIVE — AB

## 2020-05-27 MED ORDER — ONDANSETRON 4 MG PO TBDP: ORAL_TABLET | ORAL | 0 refills | Status: DC

## 2020-05-27 MED ORDER — PRENATAL COMPLETE 14-0.4 MG PO TABS: 1.0000 | ORAL_TABLET | Freq: Every day | ORAL | 0 refills | Status: AC

## 2020-05-27 MED ORDER — ONDANSETRON HCL 4 MG/2ML IJ SOLN
4.0000 mg | Freq: Once | INTRAMUSCULAR | Status: AC
  Administered 2020-05-27: 4 mg via INTRAVENOUS
  Filled 2020-05-27: qty 2

## 2020-05-27 MED ORDER — CEPHALEXIN 500 MG PO CAPS: 500.0000 mg | ORAL_CAPSULE | Freq: Three times a day (TID) | ORAL | 0 refills | Status: DC

## 2020-05-27 MED ORDER — SODIUM CHLORIDE 0.9 % IV BOLUS
1000.0000 mL | Freq: Once | INTRAVENOUS | Status: AC
  Administered 2020-05-27: 1000 mL via INTRAVENOUS

## 2020-05-27 NOTE — ED Notes (Signed)
Pt provided with water for PO challenge. States her nausea has improved and she is thirsty.

## 2020-05-27 NOTE — ED Provider Notes (Signed)
[facility] EMERGENCY DEPARTMENT Provider Note   CSN: [id number] Arrival date & time: 05/27/20  1539     History Chief Complaint  Patient presents with  . Emesis During Pregnancy    Tina Terry is a 25 y.o. female about [redacted] weeks pregnant here presenting with vomiting.  Patient states that her last menstrual period was end of March.  She states that she has intermittent vomiting since then.  For the last 3 days she was unable to keep anything down.  She states that she does not have any prenatal care right now.  She denies any vaginal bleeding or abdominal pain.  She had 3 pregnancy test at home that was positive.  She actually saw GI about a year ago and was thought to have cannabis induced hyperemesis.  Patient denies using any drugs right now.  The history is provided by the patient.       Past Medical History:  Diagnosis Date  . Renal disorder    kidney stone  . Shingles     There are no problems to display for this patient.   History reviewed. No pertinent surgical history.   OB History   No obstetric history on file.     History reviewed. No pertinent family history.  Social History   Tobacco Use  . Smoking status: Never Smoker  . Smokeless tobacco: Never Used  Vaping Use  . Vaping Use: Never used  Substance Use Topics  . Alcohol use: Yes    Comment: occ  . Drug use: Yes    Types: Marijuana    Comment: every day smoker    Home Medications Prior to Admission medications   Medication Sig Start Date End Date Taking? Authorizing Provider  metoCLOPramide (REGLAN) 10 MG tablet Take 1 tablet (10 mg total) by mouth every 6 (six) hours as needed for nausea or vomiting. 06/01/19   Molpus, Jonny Ruiz, MD    Allergies    Patient has no known allergies.  Review of Systems   Review of Systems  Gastrointestinal: Positive for vomiting.  All other systems reviewed and are negative.   Physical Exam Updated Vital Signs BP 106/72   Pulse 93   Temp 98.3  F (36.8 C) (Oral)   Resp 18   Ht 5\' 1"  (1.549 m)   Wt 54.4 kg   LMP 03/30/2020   SpO2 100%   BMI 22.67 kg/m   Physical Exam Vitals and nursing note reviewed.  HENT:     Head: Normocephalic.     Nose: Nose normal.     Mouth/Throat:     Mouth: Mucous membranes are dry.  Eyes:     Extraocular Movements: Extraocular movements intact.     Pupils: Pupils are equal, round, and reactive to light.  Cardiovascular:     Rate and Rhythm: Normal rate and regular rhythm.     Pulses: Normal pulses.     Heart sounds: Normal heart sounds.  Pulmonary:     Effort: Pulmonary effort is normal.     Breath sounds: Normal breath sounds.  Abdominal:     General: Abdomen is flat.     Palpations: Abdomen is soft.  Musculoskeletal:        General: Normal range of motion.     Cervical back: Normal range of motion and neck supple.  Skin:    General: Skin is warm.     Capillary Refill: Capillary refill takes less than 2 seconds.  Neurological:     General:  No focal deficit present.     Mental Status: She is alert.  Psychiatric:        Mood and Affect: Mood normal.        Behavior: Behavior normal.     ED Results / Procedures / Treatments   Labs (all labs ordered are listed, but only abnormal results are displayed) Labs Reviewed  CBC WITH DIFFERENTIAL/PLATELET - Abnormal; Notable for the following components:      Result Value   WBC 14.1 (*)    Platelets 441 (*)    Neutro Abs 12.7 (*)    All other components within normal limits  COMPREHENSIVE METABOLIC PANEL - Abnormal; Notable for the following components:   CO2 19 (*)    Glucose, Bld 106 (*)    All other components within normal limits  URINALYSIS, ROUTINE W REFLEX MICROSCOPIC - Abnormal; Notable for the following components:   APPearance HAZY (*)    Specific Gravity, Urine >1.030 (*)    Ketones, ur >80 (*)    Protein, ur 30 (*)    All other components within normal limits  PREGNANCY, URINE - Abnormal; Notable for the following  components:   Preg Test, Ur POSITIVE (*)    All other components within normal limits  RAPID URINE DRUG SCREEN, HOSP PERFORMED - Abnormal; Notable for the following components:   Tetrahydrocannabinol POSITIVE (*)    All other components within normal limits  HCG, QUANTITATIVE, PREGNANCY - Abnormal; Notable for the following components:   hCG, Beta Chain, Quant, S 31,658 (*)    All other components within normal limits  URINALYSIS, MICROSCOPIC (REFLEX) - Abnormal; Notable for the following components:   Bacteria, UA MANY (*)    All other components within normal limits  LIPASE, BLOOD    EKG None  Radiology No results found.  Procedures Procedures   EMERGENCY DEPARTMENT [place] PREGNANCY "Study: Limited Ultrasound of the Pelvis for Pregnancy"  INDICATIONS:Pregnancy(required) Multiple views of the uterus and pelvic cavity were obtained in real-time with a multi-frequency probe.  APPROACH:Transabdominal  PERFORMED BY: Myself IMAGES ARCHIVED?: Yes LIMITATIONS: Body habitus PREGNANCY FREE FLUID: None ADNEXAL FINDINGS:Left ovary not seen GESTATIONAL AGE, ESTIMATE:  FETAL HEART RATE:  INTERPRETATION: Intrauterine gestational sac noted and Fetal pole present      Medications Ordered in ED Medications  sodium chloride 0.9 % bolus 1,000 mL (1,000 mLs Intravenous New Bag/Given 05/27/20 1613)  ondansetron (ZOFRAN) injection 4 mg (4 mg Intravenous Given 05/27/20 1610)    ED Course  I have reviewed the triage vital signs and the nursing notes.  Pertinent labs & imaging results that were available during my care of the patient were reviewed by me and considered in my medical decision making (see chart for details).    MDM Rules/Calculators/A&P                          Tina Terry is a 25 y.o. female here with possible early pregnancy and hyperemesis.  Patient has no vaginal bleeding.  On ultrasound I was able to see a gestational sac.  There may be a small fetal pole.  I was  unable to measure it.  She has no vaginal bleeding to suggest miscarriage.  Patient is here mostly for morning sickness symptoms.  We will check labs and hCG and will hydrate and give Zofran and reassess  5:29 PM Labs unremarkable.  UA showed greater than 80 ketones.  She also has bacteria in her urine.  Will give Keflex.  Patient tolerating p.o. after Zofran.  Will discharge home with Zofran as well  Final Clinical Impression(s) / ED Diagnoses Final diagnoses:  None    Rx / DC Orders ED Discharge Orders    None       Charlynne Pander, MD 05/27/20 1729

## 2020-05-27 NOTE — ED Triage Notes (Signed)
Vomiting x 3 days. Pt reports that she is [redacted] weeks pregnant-denies prenatal care.

## 2020-05-27 NOTE — Discharge Instructions (Addendum)
Stay hydrated.  Take Zofran for nausea  Follow-up with women's health.  Of note, he had some bacteria in your urine so I prescribed some antibiotics  Please also take prenatal vitamins  Return to ER if you have worse vomiting, dehydration, vaginal bleeding

## 2020-06-02 ENCOUNTER — Emergency Department (HOSPITAL_BASED_OUTPATIENT_CLINIC_OR_DEPARTMENT_OTHER)
Admission: EM | Admit: 2020-06-02 | Discharge: 2020-06-02 | Disposition: A | Discharge status: Home / Self Care | Payer: BC Managed Care – PPO | Attending: Emergency Medicine | Admitting: Emergency Medicine

## 2020-06-02 ENCOUNTER — Other Ambulatory Visit: Payer: Self-pay

## 2020-06-02 ENCOUNTER — Encounter (HOSPITAL_BASED_OUTPATIENT_CLINIC_OR_DEPARTMENT_OTHER): Payer: Self-pay | Admitting: Emergency Medicine

## 2020-06-02 DIAGNOSIS — Z3A00 Weeks of gestation of pregnancy not specified: Secondary | ICD-10-CM | POA: Diagnosis not present

## 2020-06-02 DIAGNOSIS — R110 Nausea: Secondary | ICD-10-CM | POA: Insufficient documentation

## 2020-06-02 DIAGNOSIS — M545 Low back pain, unspecified: Secondary | ICD-10-CM | POA: Diagnosis not present

## 2020-06-02 DIAGNOSIS — O26899 Other specified pregnancy related conditions, unspecified trimester: Secondary | ICD-10-CM | POA: Diagnosis not present

## 2020-06-02 DIAGNOSIS — O210 Mild hyperemesis gravidarum: Secondary | ICD-10-CM | POA: Insufficient documentation

## 2020-06-02 HISTORY — DX: Cannabis use, unspecified, uncomplicated: F12.90

## 2020-06-02 HISTORY — DX: Nausea with vomiting, unspecified: R11.2

## 2020-06-02 HISTORY — DX: Mild hyperemesis gravidarum: O21.0

## 2020-06-02 LAB — URINALYSIS, MICROSCOPIC (REFLEX)

## 2020-06-02 LAB — COMPREHENSIVE METABOLIC PANEL
ALT: 13 U/L (ref 0–44)
AST: 15 U/L (ref 15–41)
Albumin: 4.6 g/dL (ref 3.5–5.0)
Alkaline Phosphatase: 37 U/L — ABNORMAL LOW (ref 38–126)
Anion gap: 14 (ref 5–15)
BUN: 26 mg/dL — ABNORMAL HIGH (ref 6–20)
CO2: 20 mmol/L — ABNORMAL LOW (ref 22–32)
Calcium: 9.5 mg/dL (ref 8.9–10.3)
Chloride: 104 mmol/L (ref 98–111)
Creatinine, Ser: 0.57 mg/dL (ref 0.44–1.00)
GFR, Estimated: >60 mL/min (ref >60)
Glucose, Bld: 93 mg/dL (ref 70–99)
Potassium: 3.2 mmol/L — ABNORMAL LOW (ref 3.5–5.1)
Sodium: 138 mmol/L (ref 135–145)
Total Bilirubin: 1.5 mg/dL — ABNORMAL HIGH (ref 0.3–1.2)
Total Protein: 7.4 g/dL (ref 6.5–8.1)

## 2020-06-02 LAB — CBC
HCT: 39.8 % (ref 36.0–46.0)
Hemoglobin: 13.9 g/dL (ref 12.0–15.0)
MCH: 31.3 pg (ref 26.0–34.0)
MCHC: 34.9 g/dL (ref 30.0–36.0)
MCV: 89.6 fL (ref 80.0–100.0)
Platelets: 420 10*3/uL — ABNORMAL HIGH (ref 150–400)
RBC: 4.44 MIL/uL (ref 3.87–5.11)
RDW: 12.4 % (ref 11.5–15.5)
WBC: 11.8 10*3/uL — ABNORMAL HIGH (ref 4.0–10.5)
nRBC: 0 % (ref 0.0–0.2)

## 2020-06-02 LAB — URINALYSIS, ROUTINE W REFLEX MICROSCOPIC
Glucose, UA: NEGATIVE mg/dL
Hgb urine dipstick: NEGATIVE
Ketones, ur: >80 mg/dL — AB
Leukocytes,Ua: NEGATIVE
Nitrite: NEGATIVE
Protein, ur: 30 mg/dL — AB
Specific Gravity, Urine: 1.025 (ref 1.005–1.030)
pH: 6.5 (ref 5.0–8.0)

## 2020-06-02 MED ORDER — SODIUM CHLORIDE 0.9 % IV BOLUS
1000.0000 mL | Freq: Once | INTRAVENOUS | Status: AC
  Administered 2020-06-02: 1000 mL via INTRAVENOUS

## 2020-06-02 MED ORDER — DOXYLAMINE-PYRIDOXINE 10-10 MG PO TBEC: 1.0000 | DELAYED_RELEASE_TABLET | Freq: Two times a day (BID) | ORAL | 0 refills | Status: DC | PRN

## 2020-06-02 MED ORDER — ONDANSETRON 4 MG PO TBDP: 4.0000 mg | ORAL_TABLET | Freq: Three times a day (TID) | ORAL | 0 refills | Status: DC | PRN

## 2020-06-02 MED ORDER — SODIUM CHLORIDE 0.9 % IV SOLN
1.0000 g | Freq: Once | INTRAVENOUS | Status: AC
  Administered 2020-06-02: 1 g via INTRAVENOUS
  Filled 2020-06-02: qty 10

## 2020-06-02 MED ORDER — ACETAMINOPHEN 500 MG PO TABS
1000.0000 mg | ORAL_TABLET | Freq: Once | ORAL | Status: AC
  Administered 2020-06-02: 1000 mg via ORAL
  Filled 2020-06-02: qty 2

## 2020-06-02 MED ORDER — PROMETHAZINE HCL 25 MG RE SUPP: 25.0000 mg | Freq: Four times a day (QID) | RECTAL | 0 refills | Status: DC | PRN

## 2020-06-02 MED ORDER — PROCHLORPERAZINE EDISYLATE 10 MG/2ML IJ SOLN
10.0000 mg | Freq: Once | INTRAMUSCULAR | Status: AC
  Administered 2020-06-02: 10 mg via INTRAVENOUS
  Filled 2020-06-02: qty 2

## 2020-06-02 MED ORDER — ONDANSETRON HCL 4 MG/2ML IJ SOLN
4.0000 mg | Freq: Once | INTRAMUSCULAR | Status: AC
  Administered 2020-06-02: 4 mg via INTRAVENOUS
  Filled 2020-06-02: qty 2

## 2020-06-02 NOTE — ED Provider Notes (Signed)
[facility]-EM[facility]Y [facility]PT [facility] Emergency Department Provider Note MRN:  [id number]  Arrival date & time: 06/02/20     Chief Complaint   Back Pain and Nausea   History of Present Illness   Tina Terry is a 25 y.o. year-old female with no pertinent past medical history presenting to the ED with chief complaint of back pain and nausea.  Patient explains that she was recently here in the emergency department for persistent nausea and vomiting in the setting of early pregnancy.  She was discharged home on Zofran.  She was also discharged on antibiotics for bacteria in her urine.  Over the past 2 to 3 days, has had worsening nausea, vomiting.  Unable to take her antibiotic.  Zofran not working.  Starting to have some left flank pain as well.  Denies fever.  No chest pain or shortness of breath.  No dysuria, no hematuria, no vaginal bleeding or discharge.  Symptoms moderate to severe, constant.  Feels dehydrated.  Review of Systems  A complete 10 system review of systems was obtained and all systems are negative except as noted in the HPI and PMH.   Patient's Health History    Past Medical History:  Diagnosis Date  .    Marland Kitchen Hyperemesis gravidarum   . Renal disorder    kidney stone  . Shingles     History reviewed. No pertinent surgical history.  History reviewed. No pertinent family history.  Social History   Socioeconomic History  . Marital status: Single    Spouse name: Not on file  . Number of children: Not on file  . Years of education: Not on file  . Highest education level: Not on file  Occupational History  . Not on file  Tobacco Use  . Smoking status: Never Smoker  . Smokeless tobacco: Never Used  Vaping Use  . Vaping Use: Never used  Substance and Sexual Activity  . Alcohol use: Yes    Comment: occ  . Drug use: Yes    Types: Marijuana    Comment: every day smoker  . Sexual activity: Not on file  Other Topics Concern  . Not on file  Social History  Narrative  . Not on file   Social Determinants of Health   Financial Resource Strain: Not on file  Food Insecurity: Not on file  Transportation Needs: Not on file  Physical Activity: Not on file  Stress: Not on file  Social Connections: Not on file  Intimate Partner Violence: Not on file     Physical Exam   Vitals:   06/02/20 0330 06/02/20 0430  BP: 114/79 109/76  Pulse: 89 92  Resp: 16 16  Temp:    SpO2: 99% 99%    CONSTITUTIONAL: Well-appearing, NAD NEURO:  Alert and oriented x 3, no focal deficits EYES:  eyes equal and reactive ENT/NECK:  no LAD, no JVD CARDIO: Regular rate, well-perfused, normal S1 and S2 PULM:  CTAB no wheezing or rhonchi GI/GU:  normal bowel sounds, non-distended, non-tender MSK/SPINE:  No gross deformities, no edema SKIN:  no rash, atraumatic PSYCH:  Appropriate speech and behavior  *Additional and/or pertinent findings included in MDM below  Diagnostic and Interventional Summary    EKG Interpretation  Date/Time:    Ventricular Rate:    PR Interval:    QRS Duration:   QT Interval:    QTC Calculation:   R Axis:     Text Interpretation:        Labs Reviewed  CBC - Abnormal; Notable for the following components:      Result Value   WBC 11.8 (*)    Platelets 420 (*)    All other components within normal limits  COMPREHENSIVE METABOLIC PANEL - Abnormal; Notable for the following components:   Potassium 3.2 (*)    CO2 20 (*)    BUN 26 (*)    Alkaline Phosphatase 37 (*)    Total Bilirubin 1.5 (*)    All other components within normal limits  URINALYSIS, ROUTINE W REFLEX MICROSCOPIC - Abnormal; Notable for the following components:   APPearance CLOUDY (*)    Bilirubin Urine SMALL (*)    Ketones, ur >80 (*)    Protein, ur 30 (*)    All other components within normal limits  URINALYSIS, MICROSCOPIC (REFLEX) - Abnormal; Notable for the following components:   Bacteria, UA MANY (*)    All other components within normal limits     No orders to display    Medications  sodium chloride 0.9 % bolus 1,000 mL (0 mLs Intravenous Stopped 06/02/20 0306)  ondansetron (ZOFRAN) injection 4 mg (4 mg Intravenous Given 06/02/20 0133)  cefTRIAXone (ROCEPHIN) 1 g in sodium chloride 0.9 % 100 mL IVPB (0 g Intravenous Stopped 06/02/20 0206)  prochlorperazine (COMPAZINE) injection 10 mg (10 mg Intravenous Given 06/02/20 0342)  acetaminophen (TYLENOL) tablet 1,000 mg (1,000 mg Oral Given 06/02/20 0342)     Procedures  /  Critical Care Procedures  ED Course and Medical Decision Making  I have reviewed the triage vital signs, the nursing notes, and pertinent available records from the EMR.  Listed above are laboratory and imaging tests that I personally ordered, reviewed, and interpreted and then considered in my medical decision making (see below for details).  Suspect repeat ED visit for hyperemesis gravidarum, hemodynamically stable, well-appearing.  With the new flank pain and known bacteriuria and inability to take oral antibiotics, there is new concern for possible pyelonephritis.  Providing ceftriaxone, symptomatic management, will recheck labs and reassess.     Patient feeling better after medications listed above.  Work-up overall unrevealing.  Stressed the importance of taking the antibiotics at home.  Strict return precautions for fever or inability to tolerate p.o.  Provided with additional antiemetics at home to facilitate oral antibiotics.  Elmer Sow. Pilar Plate, MD [facility] Emergency Medicine [facility] [email or web address]  Final Clinical Impressions(s) / ED Diagnoses     ICD-10-CM   1. Hyperemesis gravidarum  O21.0     ED Discharge Orders         Ordered    Doxylamine-Pyridoxine 10-10 MG TBEC  2 times daily PRN        06/02/20 0436    promethazine (PHENERGAN) 25 MG suppository  Every 6 hours PRN        06/02/20 0436    ondansetron (ZOFRAN ODT) 4 MG disintegrating tablet  Every 8 hours PRN         06/02/20 0436           Discharge Instructions Discussed with and Provided to Patient:     Discharge Instructions     You were evaluated in the Emergency Department and after careful evaluation, we did not find any emergent condition requiring admission or further testing in the hospital.  Symptoms seem to be due to morning sickness.  It is possible that your bladder infection has become a kidney infection.  It is very important that you take the Keflex antibiotic as directed.  Use the doxylamine antinausea medication as directed.  Use the Zofran antinausea medication as needed.  If you are having trouble taking pills, use the Phenergan suppositories.  Please return to the Emergency Department if you experience any worsening of your condition.  Thank you for allowing [place] to be a part of your care.        Sabas Sous, MD 06/02/20 [phone]

## 2020-06-02 NOTE — Discharge Instructions (Addendum)
You were evaluated in the Emergency Department and after careful evaluation, we did not find any emergent condition requiring admission or further testing in the hospital.  Symptoms seem to be due to morning sickness.  It is possible that your bladder infection has become a kidney infection.  It is very important that you take the Keflex antibiotic as directed.  Use the doxylamine antinausea medication as directed.  Use the Zofran antinausea medication as needed.  If you are having trouble taking pills, use the Phenergan suppositories.  Please return to the Emergency Department if you experience any worsening of your condition.  Thank you for allowing [place] to be a part of your care.

## 2020-06-02 NOTE — ED Triage Notes (Signed)
Pt states was seen last week for vomiting and back pain was told to return if no improvement. Pt feels like is symptoms are worse and has been unable to eat.

## 2020-06-08 ENCOUNTER — Emergency Department (HOSPITAL_BASED_OUTPATIENT_CLINIC_OR_DEPARTMENT_OTHER)
Admission: EM | Admit: 2020-06-08 | Discharge: 2020-06-08 | Disposition: A | Discharge status: Home / Self Care | Payer: BC Managed Care – PPO | Attending: Emergency Medicine | Admitting: Emergency Medicine

## 2020-06-08 ENCOUNTER — Other Ambulatory Visit: Payer: Self-pay

## 2020-06-08 ENCOUNTER — Encounter (HOSPITAL_BASED_OUTPATIENT_CLINIC_OR_DEPARTMENT_OTHER): Payer: Self-pay | Admitting: Emergency Medicine

## 2020-06-08 ENCOUNTER — Telehealth (HOSPITAL_BASED_OUTPATIENT_CLINIC_OR_DEPARTMENT_OTHER): Payer: Self-pay | Admitting: Emergency Medicine

## 2020-06-08 ENCOUNTER — Other Ambulatory Visit (HOSPITAL_BASED_OUTPATIENT_CLINIC_OR_DEPARTMENT_OTHER): Payer: Self-pay

## 2020-06-08 DIAGNOSIS — O219 Vomiting of pregnancy, unspecified: Secondary | ICD-10-CM | POA: Diagnosis present

## 2020-06-08 DIAGNOSIS — Z3A10 10 weeks gestation of pregnancy: Secondary | ICD-10-CM | POA: Insufficient documentation

## 2020-06-08 DIAGNOSIS — O210 Mild hyperemesis gravidarum: Secondary | ICD-10-CM

## 2020-06-08 LAB — LIPASE, BLOOD: Lipase: 27 U/L (ref 11–51)

## 2020-06-08 LAB — COMPREHENSIVE METABOLIC PANEL
ALT: 13 U/L (ref 0–44)
AST: 18 U/L (ref 15–41)
Albumin: 4.5 g/dL (ref 3.5–5.0)
Alkaline Phosphatase: 37 U/L — ABNORMAL LOW (ref 38–126)
Anion gap: 12 (ref 5–15)
BUN: 20 mg/dL (ref 6–20)
CO2: 22 mmol/L (ref 22–32)
Calcium: 9.5 mg/dL (ref 8.9–10.3)
Chloride: 100 mmol/L (ref 98–111)
Creatinine, Ser: 0.57 mg/dL (ref 0.44–1.00)
GFR, Estimated: >60 mL/min (ref >60)
Glucose, Bld: 82 mg/dL (ref 70–99)
Potassium: 3.9 mmol/L (ref 3.5–5.1)
Sodium: 134 mmol/L — ABNORMAL LOW (ref 135–145)
Total Bilirubin: 1.2 mg/dL (ref 0.3–1.2)
Total Protein: 7.7 g/dL (ref 6.5–8.1)

## 2020-06-08 LAB — URINALYSIS, ROUTINE W REFLEX MICROSCOPIC
Bilirubin Urine: NEGATIVE
Glucose, UA: NEGATIVE mg/dL
Ketones, ur: >80 mg/dL — AB
Leukocytes,Ua: NEGATIVE
Nitrite: NEGATIVE
Protein, ur: NEGATIVE mg/dL
Specific Gravity, Urine: >1.03 — ABNORMAL HIGH (ref 1.005–1.030)
pH: 6 (ref 5.0–8.0)

## 2020-06-08 LAB — CBC WITH DIFFERENTIAL/PLATELET
Abs Immature Granulocytes: 0.04 10*3/uL (ref 0.00–0.07)
Basophils Absolute: 0.1 10*3/uL (ref 0.0–0.1)
Basophils Relative: 1 %
Eosinophils Absolute: 0.1 10*3/uL (ref 0.0–0.5)
Eosinophils Relative: 0 %
HCT: 40.8 % (ref 36.0–46.0)
Hemoglobin: 14.2 g/dL (ref 12.0–15.0)
Immature Granulocytes: 0 %
Lymphocytes Relative: 9 %
Lymphs Abs: 1.1 10*3/uL (ref 0.7–4.0)
MCH: 31.2 pg (ref 26.0–34.0)
MCHC: 34.8 g/dL (ref 30.0–36.0)
MCV: 89.7 fL (ref 80.0–100.0)
Monocytes Absolute: 0.8 10*3/uL (ref 0.1–1.0)
Monocytes Relative: 7 %
Neutro Abs: 9.9 10*3/uL — ABNORMAL HIGH (ref 1.7–7.7)
Neutrophils Relative %: 83 %
Platelets: 394 10*3/uL (ref 150–400)
RBC: 4.55 MIL/uL (ref 3.87–5.11)
RDW: 12.1 % (ref 11.5–15.5)
WBC: 12 10*3/uL — ABNORMAL HIGH (ref 4.0–10.5)
nRBC: 0 % (ref 0.0–0.2)

## 2020-06-08 LAB — URINALYSIS, MICROSCOPIC (REFLEX)

## 2020-06-08 MED ORDER — ONDANSETRON HCL 4 MG/2ML IJ SOLN
4.0000 mg | Freq: Once | INTRAMUSCULAR | Status: AC
  Administered 2020-06-08: 4 mg via INTRAVENOUS
  Filled 2020-06-08: qty 2

## 2020-06-08 MED ORDER — PROMETHAZINE HCL 25 MG RE SUPP: 25.0000 mg | Freq: Four times a day (QID) | RECTAL | 0 refills | Status: DC | PRN

## 2020-06-08 MED ORDER — SODIUM CHLORIDE 0.9 % IV BOLUS
1000.0000 mL | Freq: Once | INTRAVENOUS | Status: AC
  Administered 2020-06-08: 1000 mL via INTRAVENOUS

## 2020-06-08 MED ORDER — DOXYLAMINE-PYRIDOXINE 10-10 MG PO TBEC: 1.0000 | DELAYED_RELEASE_TABLET | Freq: Two times a day (BID) | ORAL | 0 refills | Status: DC | PRN

## 2020-06-08 MED ORDER — PROMETHAZINE HCL 25 MG RE SUPP
25.0000 mg | Freq: Four times a day (QID) | RECTAL | 0 refills | Status: DC | PRN
  Filled 2020-06-08: qty 12, 3d supply, fill #0

## 2020-06-08 MED ORDER — DOXYLAMINE-PYRIDOXINE 10-10 MG PO TBEC
1.0000 | DELAYED_RELEASE_TABLET | Freq: Two times a day (BID) | ORAL | 0 refills | Status: DC | PRN
  Filled 2020-06-08: qty 60, 30d supply, fill #0

## 2020-06-08 MED ORDER — PROMETHAZINE HCL 25 MG/ML IJ SOLN
INTRAMUSCULAR | Status: AC
  Filled 2020-06-08: qty 1

## 2020-06-08 MED ORDER — SODIUM CHLORIDE 0.9 % IV SOLN
12.5000 mg | Freq: Four times a day (QID) | INTRAVENOUS | Status: DC | PRN
  Administered 2020-06-08: 12.5 mg via INTRAVENOUS
  Filled 2020-06-08: qty 0.5

## 2020-06-08 NOTE — ED Provider Notes (Addendum)
[facility] EMERGENCY DEPARTMENT Provider Note   CSN: [id number] Arrival date & time: 06/08/20  5456     History Chief Complaint  Patient presents with  . Emesis During Pregnancy    Tina Terry is a 25 y.o. female.  Has also been on antibiotic for possible UTI but unable to tolerate things by mouth and feels like she is starting of her antibiotics.  Has been trying Zofran and Phenergan suppositories with minimal relief.  The history is provided by the patient.  Emesis Severity:  Moderate Timing:  Constant Progression:  Unchanged Chronicity:  Recurrent Recent urination:  Normal Relieved by:  Nothing Worsened by:  Nothing Associated symptoms: no abdominal pain, no arthralgias, no chills, no cough, no diarrhea, no fever, no headaches, no myalgias, no sore throat and no URI   Risk factors: pregnant        Past Medical History:  Diagnosis Date  . Cannabinoid hyperemesis syndrome   . Hyperemesis gravidarum   . Renal disorder    kidney stone  . Shingles     There are no problems to display for this patient.   History reviewed. No pertinent surgical history.   OB History    Gravida  1   Para      Term      Preterm      AB      Living        SAB      IAB      Ectopic      Multiple      Live Births              History reviewed. No pertinent family history.  Social History   Tobacco Use  . Smoking status: Never Smoker  . Smokeless tobacco: Never Used  Vaping Use  . Vaping Use: Never used  Substance Use Topics  . Alcohol use: Yes    Comment: occ  . Drug use: Yes    Types: Marijuana    Comment: every day smoker    Home Medications Prior to Admission medications   Medication Sig Start Date End Date Taking? Authorizing Provider  cephALEXin (KEFLEX) 500 MG capsule Take 1 capsule (500 mg total) by mouth 3 (three) times daily. 05/27/20   Charlynne Pander, MD  Doxylamine-Pyridoxine 10-10 MG TBEC Take 1 tablet by mouth 2 (two)  times daily as needed. 06/08/20   Pollyann Savoy, MD  metoCLOPramide (REGLAN) 10 MG tablet Take 1 tablet (10 mg total) by mouth every 6 (six) hours as needed for nausea or vomiting. 06/01/19   Molpus, Jonny Ruiz, MD  ondansetron (ZOFRAN ODT) 4 MG disintegrating tablet Take 1 tablet (4 mg total) by mouth every 8 (eight) hours as needed for nausea or vomiting. 06/02/20   Sabas Sous, MD  Prenatal Vit-Fe Fumarate-FA (PRENATAL COMPLETE) 14-0.4 MG TABS Take 1 tablet by mouth daily. 05/27/20   Charlynne Pander, MD  promethazine (PHENERGAN) 25 MG suppository Place 1 suppository (25 mg total) rectally every 6 (six) hours as needed for nausea or vomiting. 06/08/20   Yaser Harvill, DO  promethazine (PHENERGAN) 25 MG suppository Place 1 suppository (25 mg total) rectally every 6 (six) hours as needed for nausea or vomiting. 06/08/20   Pollyann Savoy, MD    Allergies    Patient has no known allergies.  Review of Systems   Review of Systems  Constitutional: Negative for chills and fever.  HENT: Negative for ear pain and sore throat.  Eyes: Negative for pain and visual disturbance.  Respiratory: Negative for cough and shortness of breath.   Cardiovascular: Negative for chest pain and palpitations.  Gastrointestinal: Positive for nausea and vomiting. Negative for abdominal pain and diarrhea.  Genitourinary: Positive for flank pain. Negative for decreased urine volume, difficulty urinating, dysuria, frequency, hematuria, pelvic pain, urgency, vaginal bleeding, vaginal discharge and vaginal pain.  Musculoskeletal: Negative for arthralgias, back pain and myalgias.  Skin: Negative for color change and rash.  Neurological: Negative for seizures, syncope and headaches.  All other systems reviewed and are negative.   Physical Exam Updated Vital Signs  ED Triage Vitals  Enc Vitals Group     BP 06/08/20 0918 126/87     Pulse Rate 06/08/20 0918 90     Resp 06/08/20 0918 18     Temp 06/08/20 0918 98.1 F  (36.7 C)     Temp Source 06/08/20 0918 Oral     SpO2 06/08/20 0918 100 %     Weight 06/08/20 0919 105 lb (47.6 kg)     Height 06/08/20 0919 5\' 1"  (1.549 m)     Head Circumference --      Peak Flow --      Pain Score 06/08/20 0917 7     Pain Loc --      Pain Edu? --      Excl. in GC? --     Physical Exam Vitals and nursing note reviewed.  Constitutional:      General: She is not in acute distress.    Appearance: She is well-developed. She is not ill-appearing.  HENT:     Head: Normocephalic and atraumatic.     Nose: Nose normal.     Mouth/Throat:     Mouth: Mucous membranes are moist.  Eyes:     Extraocular Movements: Extraocular movements intact.     Conjunctiva/sclera: Conjunctivae normal.     Pupils: Pupils are equal, round, and reactive to light.  Cardiovascular:     Rate and Rhythm: Normal rate and regular rhythm.     Pulses: Normal pulses.     Heart sounds: Normal heart sounds. No murmur heard.   Pulmonary:     Effort: Pulmonary effort is normal. No respiratory distress.     Breath sounds: Normal breath sounds.  Abdominal:     Palpations: Abdomen is soft.     Tenderness: There is no abdominal tenderness.  Musculoskeletal:        General: Normal range of motion.     Cervical back: Normal range of motion and neck supple.  Skin:    General: Skin is warm and dry.  Neurological:     General: No focal deficit present.     Mental Status: She is alert.     ED Results / Procedures / Treatments   Labs (all labs ordered are listed, but only abnormal results are displayed) Labs Reviewed  URINALYSIS, ROUTINE W REFLEX MICROSCOPIC - Abnormal; Notable for the following components:      Result Value   Specific Gravity, Urine >1.030 (*)    Hgb urine dipstick SMALL (*)    Ketones, ur >80 (*)    All other components within normal limits  CBC WITH DIFFERENTIAL/PLATELET - Abnormal; Notable for the following components:   WBC 12.0 (*)    Neutro Abs 9.9 (*)    All other  components within normal limits  COMPREHENSIVE METABOLIC PANEL - Abnormal; Notable for the following components:   Sodium 134 (*)  Alkaline Phosphatase 37 (*)    All other components within normal limits  URINALYSIS, MICROSCOPIC (REFLEX) - Abnormal; Notable for the following components:   Bacteria, UA MANY (*)    Non Squamous Epithelial PRESENT (*)    All other components within normal limits  LIPASE, BLOOD    EKG None  Radiology No results found.  Procedures Procedures   Medications Ordered in ED Medications  sodium chloride 0.9 % bolus 1,000 mL (0 mLs Intravenous Stopped 06/08/20 1101)  ondansetron (ZOFRAN) injection 4 mg (4 mg Intravenous Given 06/08/20 1002)  promethazine (PHENERGAN) 25 MG/ML injection (  Return to [facility] 06/08/20 1034)  sodium chloride 0.9 % bolus 1,000 mL (0 mLs Intravenous Stopped 06/08/20 1228)  ondansetron (ZOFRAN) injection 4 mg (4 mg Intravenous Given 06/08/20 1131)    ED Course  I have reviewed the triage vital signs and the nursing notes.  Pertinent labs & imaging results that were available during my care of the patient were reviewed by me and considered in my medical decision making (see chart for details).    MDM Rules/Calculators/A&P                          Tina Terry is here with nausea and vomiting.  Likely about 8 to [redacted] weeks pregnant.  Normal vitals.  No fever.  Here with uncontrollable nausea and vomiting despite Zofran and Phenergan at home.  Overall appears well.  She was diagnosed with a UTI several days ago and has been on antibiotics but feels like she has not been able to tolerate things by mouth and afraid that she is thrown up antibiotic.  She is not having any urinary symptoms.  Normal vitals, no fever.  May be some left flank pain at times.  Ultrasound shows IUP done at the bedside by myself which is also done by other ED provider several days ago.  Denies any vaginal bleeding, abdominal pain.  Has not set up appointment with  OB yet.  Will check basic labs including urinalysis.  Will give IV fluids, Zofran, Phenergan and reevaluate.  Overall suspect hyperemesis gravidarum.  Lab work overall unremarkable.  Urinalysis negative for infection.  After additional dose of Phenergan and Zofran patient with improvement.  Given 2 L IV fluid.  Talked with the OB group over at [facility].  But, after shared decision with patient she would like to try to going home and take medication to see how she does.  She understands that she can follow-up at the MAU if she continues to have symptoms throughout tonight or tomorrow.  Suspect hyperemesis in the setting of pregnancy.  Prescribe Diclegis, promethazine has Zofran at home.  Understands return precautions.  This chart was dictated using voice recognition software.  Despite best efforts to proofread,  errors can occur which can change the documentation meaning.   Final Clinical Impression(s) / ED Diagnoses Final diagnoses:  Hyperemesis gravidarum    Rx / DC Orders ED Discharge Orders         Ordered    Doxylamine-Pyridoxine 10-10 MG TBEC  2 times daily PRN,   Status:  Discontinued        06/08/20 1219    promethazine (PHENERGAN) 25 MG suppository  Every 6 hours PRN        06/08/20 1219           Virgina Norfolk, DO 06/08/20 1221    Ishika Chesterfield, DO 06/11/20 2348

## 2020-06-08 NOTE — ED Notes (Signed)
Pt continues to have nausea and vomiting. Requesting medication.

## 2020-06-08 NOTE — Telephone Encounter (Signed)
Called to change pharmacy.

## 2020-06-08 NOTE — ED Notes (Signed)
Pt states her nausea has not improved after the phenergan

## 2020-06-08 NOTE — ED Triage Notes (Signed)
Patient has returned due to vomiting continues. Unable to keep down liquids, solids, and medications for recently diagnosed UTI. Feeling tearful and "foggy".

## 2020-06-24 ENCOUNTER — Inpatient Hospital Stay (HOSPITAL_COMMUNITY)
Admission: AD | Admit: 2020-06-24 | Discharge: 2020-06-27 | DRG: 833 | Disposition: A | Discharge status: Home / Self Care | Payer: BC Managed Care – PPO | Attending: Obstetrics and Gynecology | Admitting: Obstetrics and Gynecology

## 2020-06-24 ENCOUNTER — Other Ambulatory Visit: Payer: Self-pay

## 2020-06-24 ENCOUNTER — Encounter (HOSPITAL_COMMUNITY): Payer: Self-pay | Admitting: Obstetrics and Gynecology

## 2020-06-24 DIAGNOSIS — R112 Nausea with vomiting, unspecified: Secondary | ICD-10-CM | POA: Diagnosis present

## 2020-06-24 DIAGNOSIS — O99321 Drug use complicating pregnancy, first trimester: Principal | ICD-10-CM | POA: Diagnosis present

## 2020-06-24 DIAGNOSIS — F12188 Cannabis abuse with other cannabis-induced disorder: Secondary | ICD-10-CM | POA: Diagnosis present

## 2020-06-24 DIAGNOSIS — Z20822 Contact with and (suspected) exposure to covid-19: Secondary | ICD-10-CM | POA: Diagnosis present

## 2020-06-24 DIAGNOSIS — O210 Mild hyperemesis gravidarum: Secondary | ICD-10-CM | POA: Diagnosis present

## 2020-06-24 DIAGNOSIS — Z3A09 9 weeks gestation of pregnancy: Secondary | ICD-10-CM

## 2020-06-24 HISTORY — DX: Calculus of kidney: N20.0

## 2020-06-24 HISTORY — DX: Nausea with vomiting, unspecified: R11.2

## 2020-06-24 MED ORDER — DEXAMETHASONE SODIUM PHOSPHATE 10 MG/ML IJ SOLN
10.0000 mg | Freq: Once | INTRAMUSCULAR | Status: AC
  Administered 2020-06-25: 10 mg via INTRAVENOUS
  Filled 2020-06-24: qty 1

## 2020-06-24 MED ORDER — FAMOTIDINE IN NACL 20-0.9 MG/50ML-% IV SOLN
20.0000 mg | Freq: Once | INTRAVENOUS | Status: AC
  Administered 2020-06-25: 20 mg via INTRAVENOUS
  Filled 2020-06-24: qty 50

## 2020-06-24 MED ORDER — LACTATED RINGERS IV SOLN: Freq: Once | INTRAVENOUS | Status: AC

## 2020-06-24 MED ORDER — SCOPOLAMINE 1 MG/3DAYS TD PT72
1.0000 | MEDICATED_PATCH | TRANSDERMAL | Status: DC
  Administered 2020-06-24: 1.5 mg via TRANSDERMAL
  Filled 2020-06-24: qty 1

## 2020-06-24 MED ORDER — SODIUM CHLORIDE 0.9 % IV SOLN
12.5000 mg | Freq: Once | INTRAVENOUS | Status: AC
  Administered 2020-06-25: 12.5 mg via INTRAVENOUS
  Filled 2020-06-24: qty 0.5

## 2020-06-24 NOTE — MAU Provider Note (Addendum)
Chief Complaint: Emesis   Event Date/Time   First Provider Initiated Contact with Patient 06/24/20 2333        SUBJECTIVE HPI: Tina Terry is a 25 y.o. G1P0 at [redacted]w[redacted]d by LMP who presents to maternity admissions reporting nausea and vomiting.  Has tried Phenergan and Diclegis without relief.  Has not started [facility] yet. .This is her 4th visit for this. She has lost 8/5kg. Since 05/27/20  She has a history of cannaboid hyperemesis. She had a positive THC on 05/27/20  She denies vaginal bleeding, vaginal itching/burning, urinary symptoms, h/a, dizziness, or fever/chills.    Emesis  This is a recurrent problem. The current episode started 1 to 4 weeks ago. The problem has been unchanged. There has been no fever. Associated symptoms include weight loss. Pertinent negatives include no abdominal pain, chest pain, chills, diarrhea, dizziness, fever or myalgias. Treatments tried: phenergan and Diclegis.  RN Note Tina Terry is a 25 y.o. at [redacted]w[redacted]d here in MAU reporting: Nausea and vomiting since 7weeks. Patient reports after her last visit she was okay for a few days and then she began vomiting again. Reports she has not been able to keep anything down not even water. Reports random cramps denies any currently. Denies vaginal bleeding  Past Medical History:  Diagnosis Date   Cannabinoid hyperemesis syndrome    Hyperemesis gravidarum    Renal disorder    kidney stone   Shingles    No past surgical history on file. Social History   Socioeconomic History   Marital status: Single    Spouse name: Not on file   Number of children: Not on file   Years of education: Not on file   Highest education level: Not on file  Occupational History   Not on file  Tobacco Use   Smoking status: Never   Smokeless tobacco: Never  Vaping Use   Vaping Use: Never used  Substance and Sexual Activity   Alcohol use: Yes    Comment: occ   Drug use: Yes    Types: Marijuana    Comment: every day smoker   Sexual  activity: Not on file  Other Topics Concern   Not on file  Social History Narrative   Not on file   Social Determinants of Health   Financial Resource Strain: Not on file  Food Insecurity: Not on file  Transportation Needs: Not on file  Physical Activity: Not on file  Stress: Not on file  Social Connections: Not on file  Intimate Partner Violence: Not on file   No current facility-administered medications on file prior to encounter.   Current Outpatient Medications on File Prior to Encounter  Medication Sig Dispense Refill   cephALEXin (KEFLEX) 500 MG capsule Take 1 capsule (500 mg total) by mouth 3 (three) times daily. 21 capsule 0   Doxylamine-Pyridoxine 10-10 MG TBEC Take 1 tablet by mouth 2 (two) times daily as needed. 60 tablet 0   metoCLOPramide (REGLAN) 10 MG tablet Take 1 tablet (10 mg total) by mouth every 6 (six) hours as needed for nausea or vomiting. 12 tablet 0   ondansetron (ZOFRAN ODT) 4 MG disintegrating tablet Take 1 tablet (4 mg total) by mouth every 8 (eight) hours as needed for nausea or vomiting. 20 tablet 0   Prenatal Vit-Fe Fumarate-FA (PRENATAL COMPLETE) 14-0.4 MG TABS Take 1 tablet by mouth daily. 60 tablet 0   promethazine (PHENERGAN) 25 MG suppository Place 1 suppository (25 mg total) rectally every 6 (six) hours  as needed for nausea or vomiting. 12 each 0   promethazine (PHENERGAN) 25 MG suppository Place 1 suppository (25 mg total) rectally every 6 (six) hours as needed for nausea or vomiting. 12 each 0   No Known Allergies  I have reviewed patient's Past Medical Hx, Surgical Hx, Family Hx, Social Hx, medications and allergies.   ROS:  Review of Systems  Constitutional:  Positive for weight loss. Negative for chills and fever.  Cardiovascular:  Negative for chest pain.  Gastrointestinal:  Positive for vomiting. Negative for abdominal pain and diarrhea.  Musculoskeletal:  Negative for myalgias.  Neurological:  Negative for dizziness.  Review of  Systems  Other systems negative   Physical Exam  Physical Exam Patient Vitals for the past 24 hrs:  BP Temp Temp src Pulse Resp SpO2 Height Weight  06/24/20 2324 116/78 97.9 F (36.6 C) Oral (!) 105 16 98 % 5\' 1"  (1.549 m) 45.9 kg   Constitutional: Well-developed, well-nourished female in no acute distress.  Cardiovascular: normal rate Respiratory: normal effort GI: Abd soft, non-tender. Pos BS x 4 MS: Extremities nontender, no edema, normal ROM Neurologic: Alert and oriented x 4.  GU: Neg CVAT.  PELVIC EXAM: Cervix pink, visually closed, without lesion, scant white creamy discharge, vaginal walls and external genitalia normal Bimanual exam: Cervix 0/long/high, firm, anterior, neg CMT, uterus nontender, nonenlarged, adnexa without tenderness, enlargement, or mass   LAB RESULTS Results for orders placed or performed during the hospital encounter of 06/24/20 (from the past 24 hour(s))  Urinalysis, Routine w reflex microscopic Urine, Clean Catch     Status: Abnormal   Collection Time: 06/24/20 11:40 PM  Result Value Ref Range   Color, Urine AMBER (A) YELLOW   APPearance CLOUDY (A) CLEAR   Specific Gravity, Urine 1.028 1.005 - 1.030   pH 5.0 5.0 - 8.0   Glucose, UA NEGATIVE NEGATIVE mg/dL   Hgb urine dipstick NEGATIVE NEGATIVE   Bilirubin Urine NEGATIVE NEGATIVE   Ketones, ur 80 (A) NEGATIVE mg/dL   Protein, ur 30 (A) NEGATIVE mg/dL   Nitrite NEGATIVE NEGATIVE   Leukocytes,Ua NEGATIVE NEGATIVE   RBC / HPF 0-5 0 - 5 RBC/hpf   WBC, UA 11-20 0 - 5 WBC/hpf   Bacteria, UA MANY (A) NONE SEEN   Squamous Epithelial / LPF 6-10 0 - 5   Mucus PRESENT   Basic metabolic panel     Status: Abnormal   Collection Time: 06/25/20 12:14 AM  Result Value Ref Range   Sodium 136 135 - 145 mmol/L   Potassium 4.4 3.5 - 5.1 mmol/L   Chloride 100 98 - 111 mmol/L   CO2 21 (L) 22 - 32 mmol/L   Glucose, Bld 105 (H) 70 - 99 mg/dL   BUN 14 6 - 20 mg/dL   Creatinine, Ser 08/25/20 0.44 - 1.00 mg/dL    Calcium 2.37 8.9 - 62.8 mg/dL   GFR, Estimated 31.5 >17 mL/min   Anion gap 15 5 - 15     IMAGING Bedside >61 showed live fetus HR 160  MAU Management/MDM: Ordered IV hydration and has had two liters of fluid We have given her Promethazine, Zofran, Decadron, Scopolamine patch, and papcid.  Over the past 4 hours has not improved  Consult Dr [place] with presentation, exam findings, and results.   Will admit for ongoing treatment.   ASSESSMENT Single IUP at [redacted]w[redacted]d Hyperemesis, question cannaboid hyperemesis contributing Dehydration Weight loss of almost 10kg  PLAN Admit to [facility] uinit.  Ongoing  hydration and treatment per MD  Wynelle Bourgeois CNM, MSN Certified Nurse-Midwife 06/24/2020  11:33 PM

## 2020-06-24 NOTE — MAU Note (Signed)
..  Tina Terry is a 25 y.o. at [redacted]w[redacted]d here in MAU reporting: Nausea and vomiting since 7weeks. Patient reports after her last visit she was okay for a few days and then she began vomiting again. Reports she has not been able to keep anything down not even water. Reports random cramps denies any currently. Denies vaginal bleeding  Lab orders placed from triage: UA

## 2020-06-25 ENCOUNTER — Encounter (HOSPITAL_COMMUNITY): Payer: Self-pay | Admitting: Obstetrics and Gynecology

## 2020-06-25 DIAGNOSIS — O210 Mild hyperemesis gravidarum: Secondary | ICD-10-CM | POA: Diagnosis present

## 2020-06-25 DIAGNOSIS — F12188 Cannabis abuse with other cannabis-induced disorder: Secondary | ICD-10-CM | POA: Diagnosis present

## 2020-06-25 DIAGNOSIS — Z3A09 9 weeks gestation of pregnancy: Secondary | ICD-10-CM | POA: Diagnosis not present

## 2020-06-25 DIAGNOSIS — O99321 Drug use complicating pregnancy, first trimester: Secondary | ICD-10-CM | POA: Diagnosis present

## 2020-06-25 DIAGNOSIS — R112 Nausea with vomiting, unspecified: Secondary | ICD-10-CM | POA: Diagnosis present

## 2020-06-25 DIAGNOSIS — Z20822 Contact with and (suspected) exposure to covid-19: Secondary | ICD-10-CM | POA: Diagnosis present

## 2020-06-25 LAB — BASIC METABOLIC PANEL
Anion gap: 15 (ref 5–15)
BUN: 14 mg/dL (ref 6–20)
CO2: 21 mmol/L — ABNORMAL LOW (ref 22–32)
Calcium: 10 mg/dL (ref 8.9–10.3)
Chloride: 100 mmol/L (ref 98–111)
Creatinine, Ser: 0.67 mg/dL (ref 0.44–1.00)
GFR, Estimated: >60 mL/min (ref >60)
Glucose, Bld: 105 mg/dL — ABNORMAL HIGH (ref 70–99)
Potassium: 4.4 mmol/L (ref 3.5–5.1)
Sodium: 136 mmol/L (ref 135–145)

## 2020-06-25 LAB — URINALYSIS, ROUTINE W REFLEX MICROSCOPIC
Bilirubin Urine: NEGATIVE
Glucose, UA: NEGATIVE mg/dL
Hgb urine dipstick: NEGATIVE
Ketones, ur: 80 mg/dL — AB
Leukocytes,Ua: NEGATIVE
Nitrite: NEGATIVE
Protein, ur: 30 mg/dL — AB
Specific Gravity, Urine: 1.028 (ref 1.005–1.030)
pH: 5 (ref 5.0–8.0)

## 2020-06-25 LAB — RESP PANEL BY RT-PCR (FLU A&B, COVID) ARPGX2
Influenza A by PCR: NEGATIVE
Influenza B by PCR: NEGATIVE
SARS Coronavirus 2 by RT PCR: NEGATIVE

## 2020-06-25 MED ORDER — LACTATED RINGERS IV SOLN: INTRAVENOUS | Status: DC

## 2020-06-25 MED ORDER — FAMOTIDINE IN NACL 20-0.9 MG/50ML-% IV SOLN
20.0000 mg | Freq: Two times a day (BID) | INTRAVENOUS | Status: DC
  Administered 2020-06-25 – 2020-06-27 (×5): 20 mg via INTRAVENOUS
  Filled 2020-06-25 (×5): qty 50

## 2020-06-25 MED ORDER — METOCLOPRAMIDE HCL 10 MG PO TABS: 10.0000 mg | ORAL_TABLET | Freq: Four times a day (QID) | ORAL | Status: DC

## 2020-06-25 MED ORDER — PROMETHAZINE HCL 25 MG PO TABS
12.5000 mg | ORAL_TABLET | ORAL | Status: DC | PRN
Start: 2020-06-25 — End: 2020-06-27
  Administered 2020-06-26: 12.5 mg via ORAL
  Filled 2020-06-25: qty 1

## 2020-06-25 MED ORDER — METOCLOPRAMIDE HCL 5 MG/ML IJ SOLN
10.0000 mg | Freq: Four times a day (QID) | INTRAMUSCULAR | Status: DC
  Administered 2020-06-25 – 2020-06-27 (×10): 10 mg via INTRAVENOUS
  Filled 2020-06-25 (×11): qty 2

## 2020-06-25 MED ORDER — SODIUM CHLORIDE 0.9 % IV SOLN
8.0000 mg | Freq: Three times a day (TID) | INTRAVENOUS | Status: DC | PRN
  Administered 2020-06-25: 8 mg via INTRAVENOUS
  Filled 2020-06-25 (×2): qty 4

## 2020-06-25 MED ORDER — ONDANSETRON HCL 4 MG/2ML IJ SOLN
4.0000 mg | Freq: Three times a day (TID) | INTRAMUSCULAR | Status: DC | PRN
  Administered 2020-06-26: 4 mg via INTRAVENOUS
  Filled 2020-06-25 (×2): qty 2

## 2020-06-25 MED ORDER — LACTATED RINGERS IV SOLN: Freq: Once | INTRAVENOUS | Status: AC

## 2020-06-25 MED ORDER — HYDROXYZINE HCL 50 MG PO TABS
50.0000 mg | ORAL_TABLET | Freq: Four times a day (QID) | ORAL | Status: DC | PRN
  Filled 2020-06-25: qty 1

## 2020-06-25 MED ORDER — FAMOTIDINE 20 MG PO TABS: 20.0000 mg | ORAL_TABLET | Freq: Two times a day (BID) | ORAL | Status: DC

## 2020-06-25 MED ORDER — HYDROXYZINE HCL 50 MG/ML IM SOLN
50.0000 mg | Freq: Four times a day (QID) | INTRAMUSCULAR | Status: DC | PRN
  Filled 2020-06-25: qty 1

## 2020-06-25 MED ORDER — ONDANSETRON HCL 4 MG/2ML IJ SOLN
4.0000 mg | Freq: Once | INTRAMUSCULAR | Status: AC
  Administered 2020-06-25: 4 mg via INTRAVENOUS
  Filled 2020-06-25: qty 2

## 2020-06-25 MED ORDER — PROMETHAZINE HCL 25 MG RE SUPP
12.5000 mg | RECTAL | Status: DC | PRN
  Filled 2020-06-25: qty 1

## 2020-06-25 MED ORDER — ONDANSETRON 4 MG PO TBDP
4.0000 mg | ORAL_TABLET | Freq: Three times a day (TID) | ORAL | Status: DC | PRN
Start: 2020-06-25 — End: 2020-06-25

## 2020-06-25 NOTE — H&P (Addendum)
Chief Complaint: Emesis   Event Date/Time   First Provider Initiated Contact with Patient 06/24/20 2333        SUBJECTIVE HPI: Tina Terry is a 25 y.o. G1P0 at [redacted]w[redacted]d by LMP who presents to maternity admissions reporting nausea and vomiting.  Has tried Phenergan and Diclegis without relief.  Has not started [facility] yet. .This is her 4th visit for this. She has lost 8.5kg. Since 05/27/20  She has a history of cannaboid hyperemesis. She had a positive THC on 05/27/20  She denies vaginal bleeding, vaginal itching/burning, urinary symptoms, h/a, dizziness, or fever/chills.    Emesis  This is a recurrent problem. The current episode started 1 to 4 weeks ago. The problem has been unchanged. There has been no fever. Associated symptoms include weight loss. Pertinent negatives include no abdominal pain, chest pain, chills, diarrhea, dizziness, fever or myalgias. Treatments tried: phenergan and Diclegis.  RN Note Tina Terry is a 25 y.o. at [redacted]w[redacted]d here in MAU reporting: Nausea and vomiting since 7weeks. Patient reports after her last visit she was okay for a few days and then she began vomiting again. Reports she has not been able to keep anything down not even water. Reports random cramps denies any currently. Denies vaginal bleeding  Past Medical History:  Diagnosis Date   Cannabinoid hyperemesis syndrome    Hyperemesis gravidarum    Renal disorder    kidney stone   Shingles    No past surgical history on file. Social History   Socioeconomic History   Marital status: Single    Spouse name: Not on file   Number of children: Not on file   Years of education: Not on file   Highest education level: Not on file  Occupational History   Not on file  Tobacco Use   Smoking status: Never   Smokeless tobacco: Never  Vaping Use   Vaping Use: Never used  Substance and Sexual Activity   Alcohol use: Yes    Comment: occ   Drug use: Yes    Types: Marijuana    Comment: every day smoker   Sexual  activity: Not on file  Other Topics Concern   Not on file  Social History Narrative   Not on file   Social Determinants of Health   Financial Resource Strain: Not on file  Food Insecurity: Not on file  Transportation Needs: Not on file  Physical Activity: Not on file  Stress: Not on file  Social Connections: Not on file  Intimate Partner Violence: Not on file   No current facility-administered medications on file prior to encounter.   Current Outpatient Medications on File Prior to Encounter  Medication Sig Dispense Refill   cephALEXin (KEFLEX) 500 MG capsule Take 1 capsule (500 mg total) by mouth 3 (three) times daily. 21 capsule 0   Doxylamine-Pyridoxine 10-10 MG TBEC Take 1 tablet by mouth 2 (two) times daily as needed. 60 tablet 0   metoCLOPramide (REGLAN) 10 MG tablet Take 1 tablet (10 mg total) by mouth every 6 (six) hours as needed for nausea or vomiting. 12 tablet 0   ondansetron (ZOFRAN ODT) 4 MG disintegrating tablet Take 1 tablet (4 mg total) by mouth every 8 (eight) hours as needed for nausea or vomiting. 20 tablet 0   Prenatal Vit-Fe Fumarate-FA (PRENATAL COMPLETE) 14-0.4 MG TABS Take 1 tablet by mouth daily. 60 tablet 0   promethazine (PHENERGAN) 25 MG suppository Place 1 suppository (25 mg total) rectally every 6 (six) hours  as needed for nausea or vomiting. 12 each 0   promethazine (PHENERGAN) 25 MG suppository Place 1 suppository (25 mg total) rectally every 6 (six) hours as needed for nausea or vomiting. 12 each 0   No Known Allergies  I have reviewed patient's Past Medical Hx, Surgical Hx, Family Hx, Social Hx, medications and allergies.   ROS:  Review of Systems  Constitutional:  Positive for weight loss. Negative for chills and fever.  Cardiovascular:  Negative for chest pain.  Gastrointestinal:  Positive for vomiting. Negative for abdominal pain and diarrhea.  Musculoskeletal:  Negative for myalgias.  Neurological:  Negative for dizziness.  Review of  Systems  Other systems negative   Physical Exam  Physical Exam Patient Vitals for the past 24 hrs:  BP Temp Temp src Pulse Resp SpO2 Height Weight  06/24/20 2324 116/78 97.9 F (36.6 C) Oral (!) 105 16 98 % 5\' 1"  (1.549 m) 45.9 kg   Constitutional: Well-developed, well-nourished female in no acute distress.  Cardiovascular: normal rate Respiratory: normal effort GI: Abd soft, non-tender. Pos BS x 4 MS: Extremities nontender, no edema, normal ROM Neurologic: Alert and oriented x 4.  GU: Neg CVAT.  PELVIC EXAM: Cervix pink, visually closed, without lesion, scant white creamy discharge, vaginal walls and external genitalia normal Bimanual exam: Cervix 0/long/high, firm, anterior, neg CMT, uterus nontender, nonenlarged, adnexa without tenderness, enlargement, or mass   LAB RESULTS Results for orders placed or performed during the hospital encounter of 06/24/20 (from the past 24 hour(s))  Urinalysis, Routine w reflex microscopic Urine, Clean Catch     Status: Abnormal   Collection Time: 06/24/20 11:40 PM  Result Value Ref Range   Color, Urine AMBER (A) YELLOW   APPearance CLOUDY (A) CLEAR   Specific Gravity, Urine 1.028 1.005 - 1.030   pH 5.0 5.0 - 8.0   Glucose, UA NEGATIVE NEGATIVE mg/dL   Hgb urine dipstick NEGATIVE NEGATIVE   Bilirubin Urine NEGATIVE NEGATIVE   Ketones, ur 80 (A) NEGATIVE mg/dL   Protein, ur 30 (A) NEGATIVE mg/dL   Nitrite NEGATIVE NEGATIVE   Leukocytes,Ua NEGATIVE NEGATIVE   RBC / HPF 0-5 0 - 5 RBC/hpf   WBC, UA 11-20 0 - 5 WBC/hpf   Bacteria, UA MANY (A) NONE SEEN   Squamous Epithelial / LPF 6-10 0 - 5   Mucus PRESENT   Basic metabolic panel     Status: Abnormal   Collection Time: 06/25/20 12:14 AM  Result Value Ref Range   Sodium 136 135 - 145 mmol/L   Potassium 4.4 3.5 - 5.1 mmol/L   Chloride 100 98 - 111 mmol/L   CO2 21 (L) 22 - 32 mmol/L   Glucose, Bld 105 (H) 70 - 99 mg/dL   BUN 14 6 - 20 mg/dL   Creatinine, Ser 08/25/20 0.44 - 1.00 mg/dL    Calcium 4.49 8.9 - 20.1 mg/dL   GFR, Estimated 00.7 >12 mL/min   Anion gap 15 5 - 15     IMAGING Bedside >19 > Live fetus with HR 160  MAU Management/MDM: Ordered IV hydration and has had two liters of fluid We have given her Promethazine, Zofran, Decadron, Scopolamine patch, and papcid.  Over the past 4 hours has not improved  Consult Dr [place] with presentation, exam findings, and results.   Will admit for ongoing treatment.   ASSESSMENT Single IUP at [redacted]w[redacted]d Hyperemesis, question cannaboid hyperemesis contributing Dehydration Weight loss of almost 10kg  PLAN Admit to [facility] uinit.  Ongoing hydration and treatment per MD  Wynelle Bourgeois CNM, MSN Certified Nurse-Midwife 06/24/2020  11:33 PM

## 2020-06-25 NOTE — Progress Notes (Signed)
Call to Dr. Vergie Living regarding patient PO status. Patient requests to have chicken broth. Ok for patient to trial clears. Asked to have patient try popsicle/jello first then try chicken broth if tolerated. Ok to place clears order.

## 2020-06-26 DIAGNOSIS — F12188 Cannabis abuse with other cannabis-induced disorder: Secondary | ICD-10-CM

## 2020-06-26 DIAGNOSIS — O210 Mild hyperemesis gravidarum: Secondary | ICD-10-CM

## 2020-06-26 LAB — COMPREHENSIVE METABOLIC PANEL
ALT: 13 U/L (ref 0–44)
AST: 15 U/L (ref 15–41)
Albumin: 3.2 g/dL — ABNORMAL LOW (ref 3.5–5.0)
Alkaline Phosphatase: 23 U/L — ABNORMAL LOW (ref 38–126)
Anion gap: 7 (ref 5–15)
BUN: 9 mg/dL (ref 6–20)
CO2: 23 mmol/L (ref 22–32)
Calcium: 8.7 mg/dL — ABNORMAL LOW (ref 8.9–10.3)
Chloride: 105 mmol/L (ref 98–111)
Creatinine, Ser: 0.58 mg/dL (ref 0.44–1.00)
GFR, Estimated: >60 mL/min (ref >60)
Glucose, Bld: 91 mg/dL (ref 70–99)
Potassium: 3.8 mmol/L (ref 3.5–5.1)
Sodium: 135 mmol/L (ref 135–145)
Total Bilirubin: 1.1 mg/dL (ref 0.3–1.2)
Total Protein: 5.3 g/dL — ABNORMAL LOW (ref 6.5–8.1)

## 2020-06-26 LAB — MAGNESIUM: Magnesium: 1.7 mg/dL (ref 1.7–2.4)

## 2020-06-26 MED ORDER — ONDANSETRON HCL 4 MG/2ML IJ SOLN
4.0000 mg | Freq: Three times a day (TID) | INTRAMUSCULAR | Status: DC
  Administered 2020-06-26 – 2020-06-27 (×3): 4 mg via INTRAVENOUS
  Filled 2020-06-26 (×3): qty 2

## 2020-06-26 MED ORDER — DIPHENHYDRAMINE HCL 25 MG PO CAPS: 25.0000 mg | ORAL_CAPSULE | Freq: Four times a day (QID) | ORAL | Status: DC

## 2020-06-26 MED ORDER — MAGNESIUM SULFATE 2 GM/50ML IV SOLN
2.0000 g | Freq: Once | INTRAVENOUS | Status: AC
  Administered 2020-06-26: 2 g via INTRAVENOUS
  Filled 2020-06-26: qty 50

## 2020-06-26 MED ORDER — DIPHENHYDRAMINE HCL 25 MG PO CAPS
25.0000 mg | ORAL_CAPSULE | Freq: Three times a day (TID) | ORAL | Status: DC
  Administered 2020-06-26 (×2): 25 mg via ORAL
  Filled 2020-06-26 (×2): qty 1

## 2020-06-26 MED ORDER — ACETAMINOPHEN 325 MG PO TABS
650.0000 mg | ORAL_TABLET | ORAL | Status: DC | PRN
  Administered 2020-06-26: 650 mg via ORAL
  Filled 2020-06-26: qty 2

## 2020-06-26 MED ORDER — THIAMINE HCL 100 MG/ML IJ SOLN
Freq: Once | INTRAVENOUS | Status: AC
  Filled 2020-06-26: qty 1000

## 2020-06-26 NOTE — Progress Notes (Signed)
Daily Antepartum Note  Admission Date: 06/24/2020 Current Date: 06/26/2020 7:17 AM  Tina Terry is a 25 y.o. G1P0 @ [redacted]w[redacted]d, HD#3, admitted for acute on chronic n/v in the setting of pregnancy and thc use.  Pregnancy complicated by: Patient Active Problem List   Diagnosis Date Noted   Cannabis hyperemesis syndrome concurrent with and due to cannabis abuse (HCC) 06/26/2020   Nausea & vomiting 06/25/2020   Hyperemesis affecting pregnancy, antepartum 06/25/2020    Overnight/24hr events:  Tried clears overnight but just felt nausea and threw up some  Subjective:  Didn't get much sleep last night. Still with nausea. No SAB s/s.   Objective:    Current Vital Signs 24h Vital Sign Ranges  T 98.9 F (37.2 C) Temp  Avg: 99.1 F (37.3 C)  Min: 98.5 F (36.9 C)  Max: 99.6 F (37.6 C)  BP 117/77 BP  Min: 95/50  Max: 117/77  HR 83 Pulse  Avg: 84.8  Min: 81  Max: 89  RR 16 Resp  Avg: 16.8  Min: 16  Max: 18  SaO2 100 % Room Air SpO2  Avg: 99.8 %  Min: 99 %  Max: 100 %       24 Hour I/O Current Shift I/O  Time Ins Outs 06/10 0701 - 06/11 0700 In: 2064.6 [I.V.:1960.6] Out: 900 [Urine:700] No intake/output data recorded.   Physical exam: General: tired appearing Abdomen: nttp Cardiovascular: S1, S2 normal, no murmur, rub or gallop, regular rate and rhythm Respiratory: CTAB, no distress Extremities: no clubbing, cyanosis or edema Skin: Warm and dry.   Medications: Current Facility-Administered Medications  Medication Dose Route Frequency Provider Last Rate Last Admin   famotidine (PEPCID) IVPB 20 mg premix  20 mg Intravenous Q12H Hermina Staggers, MD 100 mL/hr at 06/25/20 2222 20 mg at 06/25/20 2222   hydrOXYzine (ATARAX/VISTARIL) tablet 50 mg  50 mg Oral Q6H PRN Hermina Staggers, MD       Or   hydrOXYzine (VISTARIL) injection 50 mg  50 mg Intramuscular Q6H PRN Hermina Staggers, MD       lactated ringers infusion   Intravenous Continuous  Bing, MD 75 mL/hr at 06/26/20 0121  New Bag at 06/26/20 0121   metoCLOPramide (REGLAN) injection 10 mg  10 mg Intravenous Q6H Hermina Staggers, MD   10 mg at 06/26/20 0340   ondansetron (ZOFRAN) injection 4 mg  4 mg Intravenous Q8H PRN Hermina Staggers, MD   4 mg at 06/26/20 0129   promethazine (PHENERGAN) tablet 12.5-25 mg  12.5-25 mg Oral Q4H PRN Hermina Staggers, MD       Or   promethazine (PHENERGAN) suppository 12.5-25 mg  12.5-25 mg Rectal Q4H PRN Hermina Staggers, MD       scopolamine (TRANSDERM-SCOP) 1 MG/3DAYS 1.5 mg  1 patch Transdermal Q72H Aviva Signs, CNM   1.5 mg at 06/24/20 2355    Labs:  No results for input(s): WBC, HGB, HCT, PLT in the last 168 hours.  Recent Labs  Lab 06/25/20 0014 06/26/20 0326  NA 136 135  K 4.4 3.8  CL 100 105  CO2 21* 23  BUN 14 9  CREATININE 0.67 0.58  CALCIUM 10.0 8.7*  PROT  --  5.3*  BILITOT  --  1.1  ALKPHOS  --  23*  ALT  --  13  AST  --  15  GLUCOSE 105* 91   Mg 1.7  Radiology:  none  Assessment & Plan:  Pt  stable *Pregnancy: normal FHTs on admit. Recheck PRN *GI: long history of this (see care everywhere). Pt states that hot, steamy showers help so THC is either the component or main contributing component of her s/s. Continue on clears and d/w to try if able. Will adjust her regimen to see if can get better effect for her *PPx: SCDs *FEN/GI: will do a banana bag and then switch back to LR MIVF. 2gm Mg ordered *Dispo: hopefully in the next few days.  Cornelia Copa MD Attending Center for [facility]ulty [facility]) GYN Consult Phone: [phone] (M-F, 0800-1700) & [phone] (Off hours, weekends, holidays)

## 2020-06-27 MED ORDER — SCOPOLAMINE 1 MG/3DAYS TD PT72: 1.0000 | MEDICATED_PATCH | TRANSDERMAL | 12 refills | Status: DC

## 2020-06-27 MED ORDER — PROMETHAZINE HCL 25 MG PO TABS: 25.0000 mg | ORAL_TABLET | Freq: Four times a day (QID) | ORAL | 2 refills | Status: DC | PRN

## 2020-06-27 MED ORDER — PYRIDOXINE HCL 100 MG/ML IJ SOLN
100.0000 mg | Freq: Every day | INTRAMUSCULAR | Status: DC
  Administered 2020-06-27: 100 mg via INTRAVENOUS
  Filled 2020-06-27: qty 1

## 2020-06-27 MED ORDER — SODIUM CHLORIDE 0.9 % IV SOLN
25.0000 mg | INTRAVENOUS | Status: DC
  Administered 2020-06-27: 25 mg via INTRAVENOUS
  Filled 2020-06-27: qty 1

## 2020-06-27 NOTE — Discharge Summary (Signed)
Physician Discharge Summary  Patient ID: Tina Terry MRN: [id number] DOB/AGE: 23/31/1997 23 y.o.  Admit date: 06/24/2020 Discharge date: 06/27/2020  Admission Diagnoses: hyperemesis  Discharge Diagnoses:  Active Problems:   Nausea & vomiting   Hyperemesis affecting pregnancy, antepartum   Cannabis hyperemesis syndrome concurrent with and due to cannabis abuse [facility])   Discharged Condition: good  Hospital Course: patient admitted with nausea and vomiting in first trimester of pregnancy. Patient received IV hydration, antiemetics and was able to tolerate oral intake. Patient requested to be discharged after lunch. Discharge instructions provided. Information to start prenatal care at [facility] provided  Consults: None   Discharge Exam: Blood pressure 103/65, pulse 80, temperature 99.2 F (37.3 C), temperature source Oral, resp. rate 16, height 5\' 1"  (1.549 m), weight 47.2 kg, SpO2 100 %. See am progress note  Disposition: Home   Allergies as of 06/27/2020   No Known Allergies      Medication List     STOP taking these medications    cephALEXin 500 MG capsule Commonly known as: KEFLEX   promethazine 25 MG suppository Commonly known as: PHENERGAN Replaced by: promethazine 25 MG tablet You also have another medication with the same name that you need to continue taking as instructed.       TAKE these medications    Doxylamine-Pyridoxine 10-10 MG Tbec Take 1 tablet by mouth 2 (two) times daily as needed.   metoCLOPramide 10 MG tablet Commonly known as: Reglan Take 1 tablet (10 mg total) by mouth every 6 (six) hours as needed for nausea or vomiting.   ondansetron 4 MG disintegrating tablet Commonly known as: Zofran ODT Take 1 tablet (4 mg total) by mouth every 8 (eight) hours as needed for nausea or vomiting.   Prenatal Complete 14-0.4 MG Tabs Take 1 tablet by mouth daily.   promethazine 25 MG suppository Commonly known as: PHENERGAN Place 1 suppository (25 mg  total) rectally every 6 (six) hours as needed for nausea or vomiting. What changed:  Another medication with the same name was added. Make sure you understand how and when to take each. Another medication with the same name was removed. Continue taking this medication, and follow the directions you see here.   promethazine 25 MG tablet Commonly known as: PHENERGAN Take 1 tablet (25 mg total) by mouth every 6 (six) hours as needed for nausea or vomiting. What changed: You were already taking a medication with the same name, and this prescription was added. Make sure you understand how and when to take each. Replaces: promethazine 25 MG suppository   scopolamine 1 MG/3DAYS Commonly known as: TRANSDERM-SCOP Place 1 patch (1.5 mg total) onto the skin every 3 (three) days.        Follow-up Information     [facility] Follow up.   Specialty: Obstetrics and Gynecology Why: An appointment will be scheduled for you to start prenatal care Contact information: [address] [place] [phone]                Signed: [phone] 06/27/2020, 4:04 PM

## 2020-06-27 NOTE — Progress Notes (Signed)
Patient ID: Tina Terry, female   DOB: 03-Jan-1996, 25 y.o.   MRN: [id number] Daily Antepartum Note  Admission Date: 06/24/2020 Current Date: 06/27/2020 9:03 AM  Tina Terry is a 25 y.o. G1P0 @ [redacted]w[redacted]d, HD#4, admitted for acute on chronic n/v in the setting of pregnancy and thc use.  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 She has tolerated some broth and saltines. She denies cramping or vaginal bleeding  Objective:    Current Vital Signs 24h Vital Sign Ranges  T 99 F (37.2 C) Temp  Avg: 99 F (37.2 C)  Min: 98.7 F (37.1 C)  Max: 99.3 F (37.4 C)  BP 106/64 BP  Min: 106/64  Max: 116/74  HR 74 Pulse  Avg: 84.8  Min: 74  Max: 94  RR 16 Resp  Avg: 16  Min: 16  Max: 16  SaO2 99 % Room Air SpO2  Avg: 99.5 %  Min: 99 %  Max: 100 %       24 Hour I/O Current Shift I/O  Time Ins Outs 06/11 0701 - 06/12 0700 In: 592 [P.O.:120; I.V.:372] Out: 350 [Urine:350] 06/12 0701 - 06/12 1900 In: 924.9 [I.V.:874.9] Out: 350 [Urine:350]   Physical exam: General: tired appearing Abdomen: nttp Cardiovascular: S1, S2 normal, no murmur, rub or gallop, regular rate and rhythm Respiratory: CTAB, no distress Extremities: no clubbing, cyanosis or edema Skin: Warm and dry.   Medications: Current Facility-Administered Medications  Medication Dose Route Frequency Provider Last Rate Last Admin   acetaminophen (TYLENOL) tablet 650 mg  650 mg Oral Q4H PRN Warden Fillers, MD   650 mg at 06/26/20 1443   diphenhydrAMINE (BENADRYL) capsule 25 mg  25 mg Oral Q8H Chalfant Bing, MD   25 mg at 06/26/20 2222   famotidine  (PEPCID) IVPB 20 mg premix  20 mg Intravenous Q12H Hermina Staggers, MD 100 mL/hr at 06/26/20 2224 20 mg at 06/26/20 2224   lactated ringers infusion   Intravenous Continuous [place] Bing, MD 75 mL/hr at 06/27/20 0139 New Bag at 06/27/20 0139   metoCLOPramide (REGLAN) injection 10 mg  10 mg Intravenous Q6H Hermina Staggers, MD   10 mg at 06/27/20 0325   ondansetron (ZOFRAN) injection 4 mg  4 mg Intravenous Leonor Liv, MD   4 mg at 06/27/20 0646   promethazine (PHENERGAN) 25 mg in sodium chloride 0.9 % 1,000 mL infusion  25 mg Intravenous Continuous Argenis Kumari, MD       pyridOXINE (B-6) injection 100 mg  100 mg Intravenous Daily Cierah Crader, MD       scopolamine (TRANSDERM-SCOP) 1 MG/3DAYS 1.5 mg  1 patch Transdermal Q72H Aviva Signs, CNM   1.5 mg at 06/24/20 2355    Labs:  No results for input(s): WBC, HGB, HCT, PLT in the last 168 hours.  Recent Labs  Lab 06/25/20 0014 06/26/20 0326  NA 136 135  K 4.4 3.8  CL 100 105  CO2 21* 23  BUN 14 9  CREATININE 0.67 0.58  CALCIUM 10.0 8.7*  PROT  --  5.3*  BILITOT  --  1.1  ALKPHOS  --  23*  ALT  --  13  AST  --  15  GLUCOSE 105* 91   Mg 1.7  Radiology:  none  Assessment & Plan:  Pt stable Pregnancy: normal FHTs on admit. Recheck PRN GI: Will adjust her regimen to see if can get better effect for her PPx: SCDs Dispo: hopefully in the next few days.

## 2020-06-27 NOTE — Plan of Care (Signed)
  Problem: Education: Goal: Knowledge of General Education information will improve Description: Including pain rating scale, medication(s)/side effects and non-pharmacologic comfort measures Outcome: Adequate for Discharge   Problem: Health Behavior/Discharge Planning: Goal: Ability to manage health-related needs will improve Outcome: Adequate for Discharge   Problem: Clinical Measurements: Goal: Ability to maintain clinical measurements within normal limits will improve Outcome: Adequate for Discharge Goal: Will remain free from infection Outcome: Adequate for Discharge Goal: Diagnostic test results will improve Outcome: Adequate for Discharge Goal: Respiratory complications will improve Outcome: Adequate for Discharge Goal: Cardiovascular complication will be avoided Outcome: Adequate for Discharge   Problem: Activity: Goal: Risk for activity intolerance will decrease Outcome: Adequate for Discharge   Problem: Nutrition: Goal: Adequate nutrition will be maintained Outcome: Adequate for Discharge   Problem: Coping: Goal: Level of anxiety will decrease Outcome: Adequate for Discharge   Problem: Elimination: Goal: Will not experience complications related to bowel motility Outcome: Adequate for Discharge Goal: Will not experience complications related to urinary retention Outcome: Adequate for Discharge   Problem: Pain Managment: Goal: General experience of comfort will improve Outcome: Adequate for Discharge   Problem: Safety: Goal: Ability to remain free from injury will improve Outcome: Adequate for Discharge   Problem: Skin Integrity: Goal: Risk for impaired skin integrity will decrease Outcome: Adequate for Discharge   Problem: Education: Goal: Knowledge of disease or condition will improve Outcome: Adequate for Discharge Goal: Knowledge of the prescribed therapeutic regimen will improve Outcome: Adequate for Discharge   Problem: Bowel/Gastric: Goal:  Occurences of nausea and/or vomiting will decrease Outcome: Adequate for Discharge   Problem: Fluid Volume: Goal: Maintenance of adequate hydration will improve Outcome: Adequate for Discharge   Problem: Nutritional: Goal: Achievement of adequate weight for body size and type will improve Outcome: Adequate for Discharge

## 2020-06-27 NOTE — Progress Notes (Addendum)
RN reviewed discharge antepartum instructions with patient regarding medications, when to call MD/go to MAU, and to call to schedule follow up antenatal appointment. RN educated patient on condition and preventative measures. Patient verbalized understanding of discharge antepartum instructions and asked appropriate questions.

## 2020-07-04 ENCOUNTER — Other Ambulatory Visit: Payer: Self-pay

## 2020-07-04 ENCOUNTER — Inpatient Hospital Stay (HOSPITAL_COMMUNITY)
Admission: AD | Admit: 2020-07-04 | Discharge: 2020-07-05 | Disposition: A | Discharge status: Home / Self Care | Payer: BC Managed Care – PPO | Attending: Family Medicine | Admitting: Family Medicine

## 2020-07-04 ENCOUNTER — Encounter (HOSPITAL_COMMUNITY): Payer: Self-pay | Admitting: Family Medicine

## 2020-07-04 DIAGNOSIS — Z3A11 11 weeks gestation of pregnancy: Secondary | ICD-10-CM | POA: Insufficient documentation

## 2020-07-04 DIAGNOSIS — O210 Mild hyperemesis gravidarum: Secondary | ICD-10-CM | POA: Diagnosis not present

## 2020-07-04 LAB — COMPREHENSIVE METABOLIC PANEL
ALT: 15 U/L (ref 0–44)
AST: 16 U/L (ref 15–41)
Albumin: 4.2 g/dL (ref 3.5–5.0)
Alkaline Phosphatase: 31 U/L — ABNORMAL LOW (ref 38–126)
Anion gap: 10 (ref 5–15)
BUN: 16 mg/dL (ref 6–20)
CO2: 24 mmol/L (ref 22–32)
Calcium: 10 mg/dL (ref 8.9–10.3)
Chloride: 103 mmol/L (ref 98–111)
Creatinine, Ser: 0.51 mg/dL (ref 0.44–1.00)
GFR, Estimated: >60 mL/min (ref >60)
Glucose, Bld: 106 mg/dL — ABNORMAL HIGH (ref 70–99)
Potassium: 3.4 mmol/L — ABNORMAL LOW (ref 3.5–5.1)
Sodium: 137 mmol/L (ref 135–145)
Total Bilirubin: 1.4 mg/dL — ABNORMAL HIGH (ref 0.3–1.2)
Total Protein: 7.3 g/dL (ref 6.5–8.1)

## 2020-07-04 LAB — URINALYSIS, MICROSCOPIC (REFLEX)

## 2020-07-04 LAB — URINALYSIS, ROUTINE W REFLEX MICROSCOPIC
Glucose, UA: NEGATIVE mg/dL
Hgb urine dipstick: NEGATIVE
Ketones, ur: 40 mg/dL — AB
Leukocytes,Ua: NEGATIVE
Nitrite: NEGATIVE
Protein, ur: 30 mg/dL — AB
Specific Gravity, Urine: >1.03 — ABNORMAL HIGH (ref 1.005–1.030)
pH: 6 (ref 5.0–8.0)

## 2020-07-04 LAB — MAGNESIUM: Magnesium: 2 mg/dL (ref 1.7–2.4)

## 2020-07-04 MED ORDER — FAMOTIDINE IN NACL 20-0.9 MG/50ML-% IV SOLN
20.0000 mg | Freq: Once | INTRAVENOUS | Status: AC
  Administered 2020-07-04: 20 mg via INTRAVENOUS
  Filled 2020-07-04: qty 50

## 2020-07-04 MED ORDER — ONDANSETRON 4 MG PO TBDP
8.0000 mg | ORAL_TABLET | Freq: Once | ORAL | Status: AC
  Administered 2020-07-04: 8 mg via ORAL
  Filled 2020-07-04: qty 2

## 2020-07-04 MED ORDER — SCOPOLAMINE 1 MG/3DAYS TD PT72: 1.0000 | MEDICATED_PATCH | TRANSDERMAL | Status: DC

## 2020-07-04 MED ORDER — ONDANSETRON HCL 4 MG/2ML IJ SOLN
4.0000 mg | Freq: Once | INTRAMUSCULAR | Status: AC
  Administered 2020-07-04: 4 mg via INTRAVENOUS
  Filled 2020-07-04: qty 2

## 2020-07-04 MED ORDER — M.V.I. ADULT IV INJ
Freq: Once | INTRAVENOUS | Status: AC
  Filled 2020-07-04: qty 10

## 2020-07-04 MED ORDER — METOCLOPRAMIDE HCL 5 MG/ML IJ SOLN
10.0000 mg | Freq: Once | INTRAMUSCULAR | Status: AC
  Administered 2020-07-04: 10 mg via INTRAVENOUS
  Filled 2020-07-04: qty 2

## 2020-07-04 MED ORDER — LACTATED RINGERS IV BOLUS
1000.0000 mL | Freq: Once | INTRAVENOUS | Status: AC
  Administered 2020-07-04: 1000 mL via INTRAVENOUS

## 2020-07-04 MED ORDER — SODIUM CHLORIDE 0.9 % IV SOLN
25.0000 mg | Freq: Once | INTRAVENOUS | Status: AC
  Administered 2020-07-04: 25 mg via INTRAVENOUS
  Filled 2020-07-04: qty 1

## 2020-07-04 NOTE — MAU Note (Signed)
Presents stating she's been vomiting since yesterday, reports unable to keep anything down.  Reports taking medications for N?V, but feels as if meds "make me throw up".

## 2020-07-04 NOTE — MAU Provider Note (Addendum)
History     CSN: [id number]  Arrival date and time: 07/04/20 1827   Event Date/Time   First Provider Initiated Contact with Patient 07/04/20 2000      Chief Complaint  Patient presents with   Emesis   Nausea   HPI Tina Terry is a 25 y.o. G1P0 at [redacted]w[redacted]d who presents with nausea and vomiting. She reports yesterday at 2000 she attempted to eat a cream cheese danish and vomited immediately. She states she then took an oral phenergan pill 30 minutes later and started vomiting profusely. She states she has vomited constantly since last night. She attempted a phenergan suppository over night with no relief and attempted another phenergan pill today with no relief. She put on a scopolamine patch and doesn't think that's helping. She does not have any other antiemetics or antacids at home. She denies any bleeding or leaking. Denies any pain.  OB History     Gravida  1   Para      Term      Preterm      AB      Living         SAB      IAB      Ectopic      Multiple      Live Births              Past Medical History:  Diagnosis Date   Cannabinoid hyperemesis syndrome    Hyperemesis gravidarum    Nausea and vomiting in adult    followed by [facility]. EGD 05/2019   Nephrolithiasis    Shingles     Past Surgical History:  Procedure Laterality Date   ESOPHAGOGASTRODUODENOSCOPY  06/06/2019    No family history on file.  Social History   Tobacco Use   Smoking status: Never   Smokeless tobacco: Never  Vaping Use   Vaping Use: Never used  Substance Use Topics   Alcohol use: Yes    Comment: occ   Drug use: Not Currently    Types: Marijuana    Allergies: No Known Allergies  Medications Prior to Admission  Medication Sig Dispense Refill Last Dose   Doxylamine-Pyridoxine 10-10 MG TBEC Take 1 tablet by mouth 2 (two) times daily as needed. 60 tablet 0 Past Week   metoCLOPramide (REGLAN) 10 MG tablet Take 1 tablet (10 mg total) by mouth every 6 (six) hours as  needed for nausea or vomiting. 12 tablet 0 Past Week   ondansetron (ZOFRAN ODT) 4 MG disintegrating tablet Take 1 tablet (4 mg total) by mouth every 8 (eight) hours as needed for nausea or vomiting. 20 tablet 0 Past Week   Prenatal Vit-Fe Fumarate-FA (PRENATAL COMPLETE) 14-0.4 MG TABS Take 1 tablet by mouth daily. 60 tablet 0 07/03/2020   promethazine (PHENERGAN) 25 MG suppository Place 1 suppository (25 mg total) rectally every 6 (six) hours as needed for nausea or vomiting. 12 each 0 07/04/2020   promethazine (PHENERGAN) 25 MG tablet Take 1 tablet (25 mg total) by mouth every 6 (six) hours as needed for nausea or vomiting. 30 tablet 2 07/04/2020   scopolamine (TRANSDERM-SCOP) 1 MG/3DAYS Place 1 patch (1.5 mg total) onto the skin every 3 (three) days. 10 patch 12 07/04/2020    Review of Systems  Constitutional: Negative.  Negative for fatigue and fever.  HENT: Negative.    Respiratory: Negative.  Negative for shortness of breath.   Cardiovascular: Negative.  Negative for chest pain.  Gastrointestinal:  Positive  for nausea and vomiting. Negative for abdominal pain, constipation and diarrhea.  Genitourinary: Negative.  Negative for dysuria, vaginal bleeding and vaginal discharge.  Neurological: Negative.  Negative for dizziness and headaches.  Physical Exam   Blood pressure 121/79, pulse (!) 125, temperature 98.8 F (37.1 C), temperature source Oral, resp. rate 20, height 5\' 1"  (1.549 m), weight 44.4 kg, SpO2 98 %.  Physical Exam Vitals and nursing note reviewed.  Constitutional:      General: She is not in acute distress.    Appearance: She is well-developed.  HENT:     Head: Normocephalic.  Eyes:     Pupils: Pupils are equal, round, and reactive to light.  Cardiovascular:     Rate and Rhythm: Regular rhythm. Tachycardia present.     Heart sounds: Normal heart sounds.  Pulmonary:     Effort: Pulmonary effort is normal. No respiratory distress.     Breath sounds: Normal breath sounds.   Abdominal:     General: Bowel sounds are normal. There is no distension.     Palpations: Abdomen is soft.     Tenderness: There is no abdominal tenderness.  Skin:    General: Skin is warm and dry.  Neurological:     Mental Status: She is alert and oriented to person, place, and time.  Psychiatric:        Mood and Affect: Mood normal.        Behavior: Behavior normal.        Thought Content: Thought content normal.        Judgment: Judgment normal.   FHT: 110 by Doppler   MAU Course  Procedures Results for orders placed or performed during the hospital encounter of 07/04/20 (from the past 24 hour(s))  Urinalysis, Routine w reflex microscopic Urine, Clean Catch     Status: Abnormal   Collection Time: 07/04/20  6:32 PM  Result Value Ref Range   Color, Urine STRAW (A) YELLOW   APPearance HAZY (A) CLEAR   Specific Gravity, Urine >1.030 (H) 1.005 - 1.030   pH 6.0 5.0 - 8.0   Glucose, UA NEGATIVE NEGATIVE mg/dL   Hgb urine dipstick NEGATIVE NEGATIVE   Bilirubin Urine SMALL (A) NEGATIVE   Ketones, ur 40 (A) NEGATIVE mg/dL   Protein, ur 30 (A) NEGATIVE mg/dL   Nitrite NEGATIVE NEGATIVE   Leukocytes,Ua NEGATIVE NEGATIVE  Urinalysis, Microscopic (reflex)     Status: Abnormal   Collection Time: 07/04/20  6:32 PM  Result Value Ref Range   RBC / HPF 0-5 0 - 5 RBC/hpf   WBC, UA 6-10 0 - 5 WBC/hpf   Bacteria, UA RARE (A) NONE SEEN   Squamous Epithelial / LPF 6-10 0 - 5   Mucus PRESENT     MDM UA CMP, Magnesium LR bolus Phenergan IV Pepcid IV Zofran IV Multivitamin IV  7lb weight loss in 8 days  Care turned over to K. Aysha Livecchi CNM at 2020.  2021, CNM 07/04/20 8:22 PM  Assumed care of patient at 2020 2015:  -Patient still endorsing nausea, will try Reglan and Zofran and then reassessment -labs reviewed, Potassium is slightly low, Mag normal -Patient tolerated PO and has napped while in MAU, no vomiting for 3 hours  Assessment and Plan   1. Hyperemesis  affecting pregnancy, antepartum   2. Morning sickness    -restarted diclegis and scop patch -continue to take phenergan -added reglan and zofran; added constipation prevention measures -discussed that meds need to be taken on  schedule or vomiting will come back.  - All questions answered -return to MAU if not able to keep down liquids Luna Kitchens

## 2020-07-05 MED ORDER — METOCLOPRAMIDE HCL 10 MG PO TABS: 10.0000 mg | ORAL_TABLET | Freq: Four times a day (QID) | ORAL | 1 refills | Status: DC | PRN

## 2020-07-05 MED ORDER — ONDANSETRON 8 MG PO TBDP: 8.0000 mg | ORAL_TABLET | Freq: Three times a day (TID) | ORAL | 0 refills | Status: DC | PRN

## 2020-07-05 MED ORDER — DOCUSATE SODIUM 100 MG PO CAPS: 300.0000 mg | ORAL_CAPSULE | Freq: Every day | ORAL | 0 refills | Status: DC

## 2020-07-05 MED ORDER — POLYETHYLENE GLYCOL 3350 17 G PO PACK: 17.0000 g | PACK | Freq: Every day | ORAL | 0 refills | Status: DC

## 2020-07-05 MED ORDER — DOXYLAMINE-PYRIDOXINE 10-10 MG PO TBEC: 1.0000 | DELAYED_RELEASE_TABLET | Freq: Two times a day (BID) | ORAL | 1 refills | Status: DC | PRN

## 2020-07-05 NOTE — Discharge Instructions (Addendum)
-  Take doxyalamine pyrodoxine for nausea, take three times a day. This does not work as well for vomiting, but it works well for nausea.  -Take Zofran every 8 hours, WATCH FOR CONSTIPATION so take docusate sodium and miralax for constipation -take Reglan every 6 hours -Change scop patch.

## 2020-07-16 ENCOUNTER — Inpatient Hospital Stay (HOSPITAL_COMMUNITY)
Admission: AD | Admit: 2020-07-16 | Discharge: 2020-07-16 | Disposition: A | Discharge status: Home / Self Care | Payer: BC Managed Care – PPO | Attending: Family Medicine | Admitting: Family Medicine

## 2020-07-16 ENCOUNTER — Other Ambulatory Visit: Payer: Self-pay

## 2020-07-16 DIAGNOSIS — Z3A00 Weeks of gestation of pregnancy not specified: Secondary | ICD-10-CM | POA: Diagnosis not present

## 2020-07-16 DIAGNOSIS — Z3A23 23 weeks gestation of pregnancy: Secondary | ICD-10-CM | POA: Diagnosis not present

## 2020-07-16 DIAGNOSIS — T7840XA Allergy, unspecified, initial encounter: Secondary | ICD-10-CM | POA: Diagnosis not present

## 2020-07-16 DIAGNOSIS — R112 Nausea with vomiting, unspecified: Secondary | ICD-10-CM

## 2020-07-16 DIAGNOSIS — O26892 Other specified pregnancy related conditions, second trimester: Secondary | ICD-10-CM | POA: Insufficient documentation

## 2020-07-16 DIAGNOSIS — O99891 Other specified diseases and conditions complicating pregnancy: Secondary | ICD-10-CM | POA: Diagnosis not present

## 2020-07-16 DIAGNOSIS — K130 Diseases of lips: Secondary | ICD-10-CM | POA: Insufficient documentation

## 2020-07-16 NOTE — MAU Provider Note (Addendum)
Event Date/Time   First Provider Initiated Contact with Patient 07/16/20 2043      S Ms. Tina Terry is a 25 y.o. G1P0 patient who presents to MAU today with complaint of possible allergic reaction. She reports that she started taking Reglan on June 20th for nausea and vomiting. Several days later started noticing that she had a lisp as well as felt as if her tongue was swollen and some trouble swallowing. She denies hives, SOB, chest pain. She took 1 dose of benadryl last week, but reports she took it to help her sleep. Has not had any since. She has no OB complaints today.   O BP 120/72 (BP Location: Right Arm)   Pulse 97   Temp 98.1 F (36.7 C)   Resp 16   Ht 5\' 1"  (1.549 m)   Wt 46.3 kg   SpO2 100%   BMI 19.27 kg/m  Physical Exam HENT:     Head: Normocephalic.     Mouth/Throat:     Mouth: Mucous membranes are moist.     Pharynx: Oropharynx is clear.     Comments: Tongue does not appear swollen Cardiovascular:     Rate and Rhythm: Normal rate.  Pulmonary:     Effort: Pulmonary effort is normal. No respiratory distress.  Abdominal:     General: Abdomen is flat.     Palpations: Abdomen is soft.  Musculoskeletal:        General: Normal range of motion.     Cervical back: Normal range of motion.  Skin:    General: Skin is warm.  Neurological:     General: No focal deficit present.     Mental Status: She is alert.  Psychiatric:        Mood and Affect: Mood normal.        Behavior: Behavior normal.        Thought Content: Thought content normal.        Judgment: Judgment normal.    A Medical screening exam complete Possible allergic reaction   P Vital signs stable, PE unremarkable Patient given the option of transfer to [facility] for further evaluation which she desires Discussed patient with Dr. [facility] who accepts patient Patient may return to MAU as needed for [facility] emergencies    [facility], MSN, CNM 07/16/2020 8:48 PM

## 2020-07-16 NOTE — MAU Note (Signed)
Camelia Eng CNM in Triage to see pt and discuss plan of care

## 2020-07-16 NOTE — MAU Note (Signed)
Started on Reglan June 20th for nausea in pregnancy. About a wk ago started noticing she is lisping. Also noticed some tongue swelling which she thinks is causing the lisp. Increased saliva. No trouble swallowing. No new pregnancy concerns.

## 2020-07-20 ENCOUNTER — Inpatient Hospital Stay (HOSPITAL_COMMUNITY)
Admission: AD | Admit: 2020-07-20 | Discharge: 2020-07-23 | DRG: 833 | Disposition: A | Discharge status: Home / Self Care | Payer: BC Managed Care – PPO | Attending: Obstetrics and Gynecology | Admitting: Obstetrics and Gynecology

## 2020-07-20 DIAGNOSIS — Z3A13 13 weeks gestation of pregnancy: Secondary | ICD-10-CM

## 2020-07-20 DIAGNOSIS — O219 Vomiting of pregnancy, unspecified: Secondary | ICD-10-CM

## 2020-07-20 DIAGNOSIS — O211 Hyperemesis gravidarum with metabolic disturbance: Principal | ICD-10-CM | POA: Diagnosis present

## 2020-07-20 DIAGNOSIS — E538 Deficiency of other specified B group vitamins: Secondary | ICD-10-CM | POA: Clinically undetermined

## 2020-07-20 DIAGNOSIS — O99011 Anemia complicating pregnancy, first trimester: Secondary | ICD-10-CM | POA: Diagnosis present

## 2020-07-20 DIAGNOSIS — O210 Mild hyperemesis gravidarum: Secondary | ICD-10-CM | POA: Diagnosis present

## 2020-07-20 DIAGNOSIS — Z20822 Contact with and (suspected) exposure to covid-19: Secondary | ICD-10-CM | POA: Diagnosis present

## 2020-07-20 DIAGNOSIS — Z888 Allergy status to other drugs, medicaments and biological substances status: Secondary | ICD-10-CM

## 2020-07-21 ENCOUNTER — Other Ambulatory Visit: Payer: Self-pay

## 2020-07-21 ENCOUNTER — Encounter (HOSPITAL_COMMUNITY): Payer: Self-pay | Admitting: Obstetrics & Gynecology

## 2020-07-21 DIAGNOSIS — O219 Vomiting of pregnancy, unspecified: Secondary | ICD-10-CM

## 2020-07-21 DIAGNOSIS — Z3A13 13 weeks gestation of pregnancy: Secondary | ICD-10-CM | POA: Diagnosis not present

## 2020-07-21 DIAGNOSIS — O210 Mild hyperemesis gravidarum: Secondary | ICD-10-CM | POA: Diagnosis not present

## 2020-07-21 DIAGNOSIS — O211 Hyperemesis gravidarum with metabolic disturbance: Secondary | ICD-10-CM | POA: Diagnosis not present

## 2020-07-21 LAB — URINALYSIS, ROUTINE W REFLEX MICROSCOPIC
Bilirubin Urine: NEGATIVE
Glucose, UA: 50 mg/dL — AB
Hgb urine dipstick: NEGATIVE
Ketones, ur: 80 mg/dL — AB
Nitrite: NEGATIVE
Protein, ur: 100 mg/dL — AB
Specific Gravity, Urine: 1.029 (ref 1.005–1.030)
pH: 5 (ref 5.0–8.0)

## 2020-07-21 LAB — BASIC METABOLIC PANEL
Anion gap: 15 (ref 5–15)
BUN: 13 mg/dL (ref 6–20)
CO2: 20 mmol/L — ABNORMAL LOW (ref 22–32)
Calcium: 9.8 mg/dL (ref 8.9–10.3)
Chloride: 101 mmol/L (ref 98–111)
Creatinine, Ser: 0.66 mg/dL (ref 0.44–1.00)
GFR, Estimated: >60 mL/min (ref >60)
Glucose, Bld: 120 mg/dL — ABNORMAL HIGH (ref 70–99)
Potassium: 3.6 mmol/L (ref 3.5–5.1)
Sodium: 136 mmol/L (ref 135–145)

## 2020-07-21 LAB — MAGNESIUM: Magnesium: 1.8 mg/dL (ref 1.7–2.4)

## 2020-07-21 LAB — RESP PANEL BY RT-PCR (FLU A&B, COVID) ARPGX2
Influenza A by PCR: NEGATIVE
Influenza B by PCR: NEGATIVE
SARS Coronavirus 2 by RT PCR: NEGATIVE

## 2020-07-21 MED ORDER — DOXYLAMINE SUCCINATE (SLEEP) 25 MG PO TABS
25.0000 mg | ORAL_TABLET | Freq: Two times a day (BID) | ORAL | Status: DC
  Administered 2020-07-21 – 2020-07-23 (×5): 25 mg via ORAL
  Filled 2020-07-21 (×5): qty 1

## 2020-07-21 MED ORDER — KCL IN DEXTROSE-NACL 20-5-0.45 MEQ/L-%-% IV SOLN
INTRAVENOUS | Status: DC
  Filled 2020-07-21 (×3): qty 1000

## 2020-07-21 MED ORDER — FAMOTIDINE IN NACL 20-0.9 MG/50ML-% IV SOLN
20.0000 mg | Freq: Once | INTRAVENOUS | Status: AC
  Administered 2020-07-21: 20 mg via INTRAVENOUS
  Filled 2020-07-21: qty 50

## 2020-07-21 MED ORDER — ONDANSETRON HCL 4 MG/2ML IJ SOLN
4.0000 mg | Freq: Once | INTRAMUSCULAR | Status: AC
  Administered 2020-07-21: 4 mg via INTRAVENOUS
  Filled 2020-07-21: qty 2

## 2020-07-21 MED ORDER — HYDROXYZINE HCL 50 MG PO TABS: 50.0000 mg | ORAL_TABLET | Freq: Four times a day (QID) | ORAL | Status: DC | PRN

## 2020-07-21 MED ORDER — MAGNESIUM SULFATE 2 GM/50ML IV SOLN
2.0000 g | Freq: Once | INTRAVENOUS | Status: AC
  Administered 2020-07-21: 2 g via INTRAVENOUS
  Filled 2020-07-21: qty 50

## 2020-07-21 MED ORDER — PROMETHAZINE HCL 25 MG PO TABS: 12.5000 mg | ORAL_TABLET | ORAL | Status: DC | PRN

## 2020-07-21 MED ORDER — ONDANSETRON HCL 4 MG/2ML IJ SOLN
4.0000 mg | Freq: Three times a day (TID) | INTRAMUSCULAR | Status: DC
  Administered 2020-07-21 – 2020-07-23 (×7): 4 mg via INTRAVENOUS
  Filled 2020-07-21 (×7): qty 2

## 2020-07-21 MED ORDER — SODIUM CHLORIDE 0.9 % IV SOLN
12.5000 mg | Freq: Once | INTRAVENOUS | Status: AC
  Administered 2020-07-21: 12.5 mg via INTRAVENOUS
  Filled 2020-07-21: qty 0.5

## 2020-07-21 MED ORDER — DEXAMETHASONE SODIUM PHOSPHATE 10 MG/ML IJ SOLN
10.0000 mg | Freq: Once | INTRAMUSCULAR | Status: AC
  Administered 2020-07-21: 10 mg via INTRAVENOUS
  Filled 2020-07-21: qty 1

## 2020-07-21 MED ORDER — LACTATED RINGERS IV SOLN: Freq: Once | INTRAVENOUS | Status: AC

## 2020-07-21 MED ORDER — PROMETHAZINE HCL 25 MG RE SUPP
12.5000 mg | RECTAL | Status: DC | PRN
Start: 2020-07-21 — End: 2020-07-23
  Filled 2020-07-21: qty 1

## 2020-07-21 MED ORDER — THIAMINE HCL 100 MG/ML IJ SOLN
INTRAVENOUS | Status: DC
  Filled 2020-07-21 (×3): qty 1000

## 2020-07-21 MED ORDER — SCOPOLAMINE 1 MG/3DAYS TD PT72: 1.0000 | MEDICATED_PATCH | TRANSDERMAL | Status: DC

## 2020-07-21 MED ORDER — SODIUM CHLORIDE 0.9 % IV SOLN
8.0000 mg | Freq: Three times a day (TID) | INTRAVENOUS | Status: DC
  Filled 2020-07-21 (×11): qty 4

## 2020-07-21 MED ORDER — CALCIUM CARBONATE ANTACID 500 MG PO CHEW: 2.0000 | CHEWABLE_TABLET | ORAL | Status: DC | PRN

## 2020-07-21 MED ORDER — HYDROXYZINE HCL 50 MG/ML IM SOLN
50.0000 mg | Freq: Four times a day (QID) | INTRAMUSCULAR | Status: DC | PRN
  Filled 2020-07-21: qty 1

## 2020-07-21 MED ORDER — PYRIDOXINE HCL 100 MG/ML IJ SOLN
100.0000 mg | Freq: Every day | INTRAMUSCULAR | Status: AC
  Administered 2020-07-21: 100 mg via INTRAVENOUS
  Filled 2020-07-21: qty 1

## 2020-07-21 MED ORDER — ONDANSETRON 4 MG PO TBDP: 4.0000 mg | ORAL_TABLET | Freq: Three times a day (TID) | ORAL | Status: DC

## 2020-07-21 MED ORDER — FAMOTIDINE 20 MG PO TABS: 20.0000 mg | ORAL_TABLET | Freq: Two times a day (BID) | ORAL | Status: DC

## 2020-07-21 MED ORDER — VITAMIN B-6 25 MG PO TABS
25.0000 mg | ORAL_TABLET | Freq: Two times a day (BID) | ORAL | Status: DC
  Administered 2020-07-21 – 2020-07-23 (×5): 25 mg via ORAL
  Filled 2020-07-21 (×5): qty 1

## 2020-07-21 MED ORDER — SCOPOLAMINE 1 MG/3DAYS TD PT72
1.0000 | MEDICATED_PATCH | Freq: Once | TRANSDERMAL | Status: DC
  Administered 2020-07-23: 1.5 mg via TRANSDERMAL
  Filled 2020-07-21: qty 1

## 2020-07-21 MED ORDER — FAMOTIDINE IN NACL 20-0.9 MG/50ML-% IV SOLN
20.0000 mg | Freq: Two times a day (BID) | INTRAVENOUS | Status: DC
  Administered 2020-07-21 – 2020-07-22 (×4): 20 mg via INTRAVENOUS
  Filled 2020-07-21 (×4): qty 50

## 2020-07-21 NOTE — MAU Provider Note (Addendum)
Chief Complaint:  Emesis During Pregnancy   Event Date/Time   First Provider Initiated Contact with Patient 07/21/20 0033     HPI: Tina Terry is a 25 y.o. G1P0 at 60w3dwho presents to maternity admissions reporting persistent nausea and vomiting.  States took Diclegis this morning.  States took a half of a Reglan, though she reports she is allergic to it and it makes her tongue swell (though it did not cause a reaction today)   Did not take any other meds after that "because they don't work".  States smoked "one small" marijuana cigarette " a week ago".  Denies any more use after that.  State it helped her nausea. .She denies LOF, vaginal bleeding, urinary symptoms, h/a, diarrhea, constipation or fever/chills Has lost 2.3kg in the past 5 days (10.4kg since 05/27/20) Emesis  This is a recurrent problem. The current episode started more than 1 month ago. The problem has been unchanged. There has been no fever. Pertinent negatives include no abdominal pain, chills, diarrhea or myalgias. Treatments tried: one dose of Diclegis in the morning. The treatment provided no relief.  .   RN Note: N/V for 4 days but worse last 2 days. Unable to keep down anything. Allergic to Reglan (tongue swells) and other nausea meds not working  Past Medical History: Past Medical History:  Diagnosis Date   Cannabinoid hyperemesis syndrome    Hyperemesis gravidarum    Nausea and vomiting in adult    followed by [facility]. EGD 05/2019   Nephrolithiasis    Shingles     Past obstetric history: OB History  Gravida Para Term Preterm AB Living  1            SAB IAB Ectopic Multiple Live Births               # Outcome Date GA Lbr Len/2nd Weight Sex Delivery Anes PTL Lv  1 Current             Past Surgical History: Past Surgical History:  Procedure Laterality Date   ESOPHAGOGASTRODUODENOSCOPY  06/06/2019    Family History: No family history on file.  Social History: Social History   Tobacco Use   Smoking  status: Never   Smokeless tobacco: Never  Vaping Use   Vaping Use: Never used  Substance Use Topics   Alcohol use: Yes    Comment: occ   Drug use: Not Currently    Types: Marijuana    Allergies: No Known Allergies  Meds:  Medications Prior to Admission  Medication Sig Dispense Refill Last Dose   docusate sodium (COLACE) 100 MG capsule Take 3 capsules (300 mg total) by mouth daily. 90 capsule 0    Doxylamine-Pyridoxine 10-10 MG TBEC Take 1 tablet by mouth 2 (two) times daily as needed. 60 tablet 1    metoCLOPramide (REGLAN) 10 MG tablet Take 1 tablet (10 mg total) by mouth every 6 (six) hours as needed for nausea or vomiting. 60 tablet 1    ondansetron (ZOFRAN ODT) 8 MG disintegrating tablet Take 1 tablet (8 mg total) by mouth every 8 (eight) hours as needed for nausea or vomiting. 30 tablet 0    polyethylene glycol (MIRALAX / GLYCOLAX) 17 g packet Take 17 g by mouth daily. 14 each 0    Prenatal Vit-Fe Fumarate-FA (PRENATAL COMPLETE) 14-0.4 MG TABS Take 1 tablet by mouth daily. 60 tablet 0    promethazine (PHENERGAN) 25 MG suppository Place 1 suppository (25 mg total) rectally every 6 (  six) hours as needed for nausea or vomiting. 12 each 0    promethazine (PHENERGAN) 25 MG tablet Take 1 tablet (25 mg total) by mouth every 6 (six) hours as needed for nausea or vomiting. 30 tablet 2    scopolamine (TRANSDERM-SCOP) 1 MG/3DAYS Place 1 patch (1.5 mg total) onto the skin every 3 (three) days. 10 patch 12     I have reviewed patient's Past Medical Hx, Surgical Hx, Family Hx, Social Hx, medications and allergies.   ROS:  Review of Systems  Constitutional:  Negative for chills.  Gastrointestinal:  Positive for vomiting. Negative for abdominal pain and diarrhea.  Musculoskeletal:  Negative for myalgias.  Other systems negative  Physical Exam  Patient Vitals for the past 24 hrs:  BP Pulse Resp SpO2 Height Weight  07/21/20 0021 -- (!) 105 -- 99 % -- --  07/21/20 0016 127/81 (!) 114 16 --  5\' 1"  (1.549 m) 44 kg   Constitutional: Well-developed, well-nourished female in no acute distress.  Cardiovascular: normal rate Respiratory: normal effort GI: Abd soft, non-tender, gravid appropriate for gestational age.   No rebound or guarding. MS: Extremities nontender, no edema, normal ROM Neurologic: Alert and oriented x 4.  GU: Neg CVAT.  PELVIC EXAM: deferred FHT:  154   Labs: Results for orders placed or performed during the hospital encounter of 07/20/20 (from the past 24 hour(s))  Urinalysis, Routine w reflex microscopic Urine, Clean Catch     Status: Abnormal   Collection Time: 07/21/20 12:56 AM  Result Value Ref Range   Color, Urine AMBER (A) YELLOW   APPearance CLOUDY (A) CLEAR   Specific Gravity, Urine 1.029 1.005 - 1.030   pH 5.0 5.0 - 8.0   Glucose, UA 50 (A) NEGATIVE mg/dL   Hgb urine dipstick NEGATIVE NEGATIVE   Bilirubin Urine NEGATIVE NEGATIVE   Ketones, ur 80 (A) NEGATIVE mg/dL   Protein, ur 09/21/20 (A) NEGATIVE mg/dL   Nitrite NEGATIVE NEGATIVE   Leukocytes,Ua TRACE (A) NEGATIVE   RBC / HPF 0-5 0 - 5 RBC/hpf   WBC, UA 0-5 0 - 5 WBC/hpf   Bacteria, UA RARE (A) NONE SEEN   Squamous Epithelial / LPF 0-5 0 - 5   Mucus PRESENT   Basic metabolic panel     Status: Abnormal   Collection Time: 07/21/20 12:56 AM  Result Value Ref Range   Sodium 136 135 - 145 mmol/L   Potassium 3.6 3.5 - 5.1 mmol/L   Chloride 101 98 - 111 mmol/L   CO2 20 (L) 22 - 32 mmol/L   Glucose, Bld 120 (H) 70 - 99 mg/dL   BUN 13 6 - 20 mg/dL   Creatinine, Ser 09/21/20 0.44 - 1.00 mg/dL   Calcium 9.8 8.9 - 5.70 mg/dL   GFR, Estimated 17.7 >93 mL/min   Anion gap 15 5 - 15  Magnesium     Status: None   Collection Time: 07/21/20 12:56 AM  Result Value Ref Range   Magnesium 1.8 1.7 - 2.4 mg/dL    Imaging:  No results found.  MAU Course/MDM: I have ordered labs and reviewed results. These are normal except for ketonuria Consult Dr 09/21/20 with presentation, exam findings and test results.  Offered admission for observation overnight with reassessment in am.  Treatments in MAU included 2 liters IVF, Phenergan, Zofran, . Decadron, Pyridoxine, Pepcid (has scopolamine patch on)   Assessment: Single IUP at [redacted]w[redacted]d Hyperemesis Dehydration Weight loss  Plan: Admit to OBSCU for overnight observation IV hydration  Antiemetics Dietary consult MD to follow  Lakasha Mcfall CNM, MSN Certified Nurse-Midwife 07/21/2020 12:33 AM  

## 2020-07-21 NOTE — Progress Notes (Signed)
Initial Nutrition Assessment  DOCUMENTATION CODES:  Not applicable  INTERVENTION:  Currently NPO w/ IVF at 125 ml/hr ( + MVI) Advance to C/L as clinical status allows, with order of Boost Breeze  Is steroid taper an option ?  If unsuccessful advancement to solid food, may need to consider tubefeedings  NUTRITION DIAGNOSIS:   Inadequate oral intake related to nausea, vomiting as evidenced by percent weight loss. 8% loss of usual weight, however this is calculated on admission weight and this % will decline once hydrated  GOAL:  Patient will meet greater than or equal to 90% of their needs  MONITOR:  Weight trends  REASON FOR ASSESSMENT:  Consult Hyperemesis  ASSESSMENT:  13 3/7 weeks, 3rd admission, plus 3 ED visits for hyperemesis.  usual wt 45-47 kg, current wt 92% of usual, Upon chart review pt has hx of GI eval for n/v 1 year ago   Diet Order:   Diet Order             Diet NPO time specified  Diet effective now                  EDUCATION NEEDS:   No education needs have been identified at this time  Skin:  Skin Assessment: Reviewed RN Assessment  Height:   Ht Readings from Last 1 Encounters:  07/21/20 5\' 1"  (1.549 m)    Weight:   Wt Readings from Last 1 Encounters:  07/21/20 44 kg    Ideal Body Weight:   100-105 lbs  BMI:  Body mass index is 18.33 kg/m.  Estimated Nutritional Needs:   Kcal:  1600-1800  Protein:  75-85 g  Fluid:  2 L    09/21/20 M.Elisabeth Cara LDN Neonatal Nutrition Support Specialist/RD III

## 2020-07-21 NOTE — H&P (Signed)
Chief Complaint:  Emesis During Pregnancy   Event Date/Time   First Provider Initiated Contact with Patient 07/21/20 0033     HPI: Tina Terry is a 25 y.o. G1P0 at 60w3dwho presents to maternity admissions reporting persistent nausea and vomiting.  States took Diclegis this morning.  States took a half of a Reglan, though she reports she is allergic to it and it makes her tongue swell (though it did not cause a reaction today)   Did not take any other meds after that "because they don't work".  States smoked "one small" marijuana cigarette " a week ago".  Denies any more use after that.  State it helped her nausea. .She denies LOF, vaginal bleeding, urinary symptoms, h/a, diarrhea, constipation or fever/chills Has lost 2.3kg in the past 5 days (10.4kg since 05/27/20) Emesis  This is a recurrent problem. The current episode started more than 1 month ago. The problem has been unchanged. There has been no fever. Pertinent negatives include no abdominal pain, chills, diarrhea or myalgias. Treatments tried: one dose of Diclegis in the morning. The treatment provided no relief.  .   RN Note: N/V for 4 days but worse last 2 days. Unable to keep down anything. Allergic to Reglan (tongue swells) and other nausea meds not working  Past Medical History: Past Medical History:  Diagnosis Date   Cannabinoid hyperemesis syndrome    Hyperemesis gravidarum    Nausea and vomiting in adult    followed by [facility]. EGD 05/2019   Nephrolithiasis    Shingles     Past obstetric history: OB History  Gravida Para Term Preterm AB Living  1            SAB IAB Ectopic Multiple Live Births               # Outcome Date GA Lbr Len/2nd Weight Sex Delivery Anes PTL Lv  1 Current             Past Surgical History: Past Surgical History:  Procedure Laterality Date   ESOPHAGOGASTRODUODENOSCOPY  06/06/2019    Family History: No family history on file.  Social History: Social History   Tobacco Use   Smoking  status: Never   Smokeless tobacco: Never  Vaping Use   Vaping Use: Never used  Substance Use Topics   Alcohol use: Yes    Comment: occ   Drug use: Not Currently    Types: Marijuana    Allergies: No Known Allergies  Meds:  Medications Prior to Admission  Medication Sig Dispense Refill Last Dose   docusate sodium (COLACE) 100 MG capsule Take 3 capsules (300 mg total) by mouth daily. 90 capsule 0    Doxylamine-Pyridoxine 10-10 MG TBEC Take 1 tablet by mouth 2 (two) times daily as needed. 60 tablet 1    metoCLOPramide (REGLAN) 10 MG tablet Take 1 tablet (10 mg total) by mouth every 6 (six) hours as needed for nausea or vomiting. 60 tablet 1    ondansetron (ZOFRAN ODT) 8 MG disintegrating tablet Take 1 tablet (8 mg total) by mouth every 8 (eight) hours as needed for nausea or vomiting. 30 tablet 0    polyethylene glycol (MIRALAX / GLYCOLAX) 17 g packet Take 17 g by mouth daily. 14 each 0    Prenatal Vit-Fe Fumarate-FA (PRENATAL COMPLETE) 14-0.4 MG TABS Take 1 tablet by mouth daily. 60 tablet 0    promethazine (PHENERGAN) 25 MG suppository Place 1 suppository (25 mg total) rectally every 6 (  six) hours as needed for nausea or vomiting. 12 each 0    promethazine (PHENERGAN) 25 MG tablet Take 1 tablet (25 mg total) by mouth every 6 (six) hours as needed for nausea or vomiting. 30 tablet 2    scopolamine (TRANSDERM-SCOP) 1 MG/3DAYS Place 1 patch (1.5 mg total) onto the skin every 3 (three) days. 10 patch 12     I have reviewed patient's Past Medical Hx, Surgical Hx, Family Hx, Social Hx, medications and allergies.   ROS:  Review of Systems  Constitutional:  Negative for chills.  Gastrointestinal:  Positive for vomiting. Negative for abdominal pain and diarrhea.  Musculoskeletal:  Negative for myalgias.  Other systems negative  Physical Exam  Patient Vitals for the past 24 hrs:  BP Pulse Resp SpO2 Height Weight  07/21/20 0021 -- (!) 105 -- 99 % -- --  07/21/20 0016 127/81 (!) 114 16 --  5\' 1"  (1.549 m) 44 kg   Constitutional: Well-developed, well-nourished female in no acute distress.  Cardiovascular: normal rate Respiratory: normal effort GI: Abd soft, non-tender, gravid appropriate for gestational age.   No rebound or guarding. MS: Extremities nontender, no edema, normal ROM Neurologic: Alert and oriented x 4.  GU: Neg CVAT.  PELVIC EXAM: deferred FHT:  154   Labs: Results for orders placed or performed during the hospital encounter of 07/20/20 (from the past 24 hour(s))  Urinalysis, Routine w reflex microscopic Urine, Clean Catch     Status: Abnormal   Collection Time: 07/21/20 12:56 AM  Result Value Ref Range   Color, Urine AMBER (A) YELLOW   APPearance CLOUDY (A) CLEAR   Specific Gravity, Urine 1.029 1.005 - 1.030   pH 5.0 5.0 - 8.0   Glucose, UA 50 (A) NEGATIVE mg/dL   Hgb urine dipstick NEGATIVE NEGATIVE   Bilirubin Urine NEGATIVE NEGATIVE   Ketones, ur 80 (A) NEGATIVE mg/dL   Protein, ur 09/21/20 (A) NEGATIVE mg/dL   Nitrite NEGATIVE NEGATIVE   Leukocytes,Ua TRACE (A) NEGATIVE   RBC / HPF 0-5 0 - 5 RBC/hpf   WBC, UA 0-5 0 - 5 WBC/hpf   Bacteria, UA RARE (A) NONE SEEN   Squamous Epithelial / LPF 0-5 0 - 5   Mucus PRESENT   Basic metabolic panel     Status: Abnormal   Collection Time: 07/21/20 12:56 AM  Result Value Ref Range   Sodium 136 135 - 145 mmol/L   Potassium 3.6 3.5 - 5.1 mmol/L   Chloride 101 98 - 111 mmol/L   CO2 20 (L) 22 - 32 mmol/L   Glucose, Bld 120 (H) 70 - 99 mg/dL   BUN 13 6 - 20 mg/dL   Creatinine, Ser 09/21/20 0.44 - 1.00 mg/dL   Calcium 9.8 8.9 - 5.70 mg/dL   GFR, Estimated 17.7 >93 mL/min   Anion gap 15 5 - 15  Magnesium     Status: None   Collection Time: 07/21/20 12:56 AM  Result Value Ref Range   Magnesium 1.8 1.7 - 2.4 mg/dL    Imaging:  No results found.  MAU Course/MDM: I have ordered labs and reviewed results. These are normal except for ketonuria Consult Dr 09/21/20 with presentation, exam findings and test results.  Offered admission for observation overnight with reassessment in am.  Treatments in MAU included 2 liters IVF, Phenergan, Zofran, . Decadron, Pyridoxine, Pepcid (has scopolamine patch on)   Assessment: Single IUP at [redacted]w[redacted]d Hyperemesis Dehydration Weight loss  Plan: Admit to OBSCU for overnight observation IV hydration  Antiemetics Dietary consult MD to follow  Wynelle Bourgeois CNM, MSN Certified Nurse-Midwife 07/21/2020 12:33 AM

## 2020-07-21 NOTE — MAU Note (Signed)
N/V for 4 days but worse last 2 days. Unable to keep down anything. Allergic to Reglan (tongue swells) and other nausea meds not working

## 2020-07-21 NOTE — Progress Notes (Signed)
To [facility]  via w/c °

## 2020-07-22 ENCOUNTER — Encounter: Payer: BC Managed Care – PPO | Admitting: Family Medicine

## 2020-07-22 DIAGNOSIS — Z3A13 13 weeks gestation of pregnancy: Secondary | ICD-10-CM | POA: Diagnosis not present

## 2020-07-22 DIAGNOSIS — O210 Mild hyperemesis gravidarum: Secondary | ICD-10-CM

## 2020-07-22 DIAGNOSIS — Z888 Allergy status to other drugs, medicaments and biological substances status: Secondary | ICD-10-CM | POA: Diagnosis not present

## 2020-07-22 DIAGNOSIS — O99011 Anemia complicating pregnancy, first trimester: Secondary | ICD-10-CM | POA: Diagnosis present

## 2020-07-22 DIAGNOSIS — O219 Vomiting of pregnancy, unspecified: Secondary | ICD-10-CM | POA: Diagnosis present

## 2020-07-22 DIAGNOSIS — E538 Deficiency of other specified B group vitamins: Secondary | ICD-10-CM | POA: Diagnosis present

## 2020-07-22 DIAGNOSIS — O211 Hyperemesis gravidarum with metabolic disturbance: Secondary | ICD-10-CM | POA: Diagnosis present

## 2020-07-22 DIAGNOSIS — Z20822 Contact with and (suspected) exposure to covid-19: Secondary | ICD-10-CM | POA: Diagnosis present

## 2020-07-22 MED ORDER — DEXTROSE IN LACTATED RINGERS 5 % IV SOLN: INTRAVENOUS | Status: DC

## 2020-07-22 NOTE — Progress Notes (Signed)
Daily Antepartum Note  Admission Date: 07/20/2020 Current Date: 07/22/2020 9:50 AM  Tina Terry is a 25 y.o. G1@ [redacted]w[redacted]d, HD#2, admitted for acute on chronic n/v.  Pregnancy complicated by: H/o chronic n/v (see care everywhere for non pregnancy GI work up) Patient Active Problem List   Diagnosis Date Noted   Cannabis hyperemesis syndrome concurrent with and due to cannabis abuse (HCC) 06/26/2020   Nausea & vomiting 06/25/2020   Hyperemesis affecting pregnancy, antepartum 06/25/2020    Overnight/24hr events:  none  Subjective:  No throwing up overnight and no nausea this morning. Pt would like to try some broth  Objective:    Current Vital Signs 24h Vital Sign Ranges  T 98.4 F (36.9 C) Temp  Avg: 98.1 F (36.7 C)  Min: 97.9 F (36.6 C)  Max: 98.4 F (36.9 C)  BP (!) 92/57  BP  Min: 85/45  Max: 105/60  HR 72 Pulse  Avg: 74.6  Min: 67  Max: 86  RR 18 Resp  Avg: 17  Min: 16  Max: 18  SaO2 100 % Room Air SpO2  Avg: 99.7 %  Min: 99 %  Max: 100 %       24 Hour I/O Current Shift I/O  Time Ins Outs 07/06 0701 - 07/07 0700 In: 3031.8 [P.O.:240; I.V.:2691.8] Out: 800 [Urine:800] 07/07 0701 - 07/07 1900 In: -  Out: 300 [Urine:300]   Weight  7/7: 45.9kg 7/6: 44kg  Fetal Heart Tones: pending  Physical exam: General: Well nourished, well developed female in no acute distress. Abdomen: nttp Cardiovascular: S1, S2 normal, no murmur, rub or gallop, regular rate and rhythm Respiratory: CTAB Extremities: no clubbing, cyanosis or edema Skin: Warm and dry.   Medications: Current Facility-Administered Medications  Medication Dose Route Frequency Provider Last Rate Last Admin   dextrose 5 % in lactated ringers infusion   Intravenous Continuous Eagle Bing, MD 100 mL/hr at 07/22/20 0838 New Bag at 07/22/20 0838   vitamin B-6 (pyridOXINE) tablet 25 mg  25 mg Oral BID Myna Hidalgo, DO   25 mg at 07/21/20 2341   And   doxylamine (Sleep) (UNISOM) tablet 25 mg  25 mg Oral BID  Myna Hidalgo, DO   25 mg at 07/21/20 2341   famotidine (PEPCID) IVPB 20 mg premix  20 mg Intravenous Q12H Myna Hidalgo, DO 100 mL/hr at 07/21/20 2347 20 mg at 07/21/20 2347   ondansetron (ZOFRAN-ODT) disintegrating tablet 4-8 mg  4-8 mg Oral Q8H Myna Hidalgo, DO       Or   ondansetron (ZOFRAN) injection 4 mg  4 mg Intravenous Q8H Myna Hidalgo, DO   4 mg at 07/22/20 7026   Or   ondansetron (ZOFRAN) 8 mg in sodium chloride 0.9 % 50 mL IVPB  8 mg Intravenous Q8H Ozan, Jennifer, DO       promethazine (PHENERGAN) tablet 12.5-25 mg  12.5-25 mg Oral Q4H PRN Myna Hidalgo, DO       Or   promethazine (PHENERGAN) suppository 12.5-25 mg  12.5-25 mg Rectal Q4H PRN Myna Hidalgo, DO       scopolamine (TRANSDERM-SCOP) 1 MG/3DAYS 1.5 mg  1 patch Transdermal Once Aviva Signs, CNM        Labs:  No new labs  Radiology:  No new imaging  Assessment & Plan:  Pt improving *Pregnancy: f/u qday FHTs *PPx: SCDs *FEN/GI: MIVF until more PO. Will try clears this morning and if does okay with that then can advance to full liquid. Pt states  she was on reglan at home but stopped due to tongue swelling. She was able to take the non-odt zofran down but it didn't help with her s/s. Likely will try and transition to PO meds tomorrow.  *Dispo: maybe late tomorrow vs saturday  Cornelia Copa MD Attending Center for [facility]) GYN Consult Phone: [phone] (M-F, 0800-1700) & [phone] (Off hours, weekends, holidays)

## 2020-07-23 ENCOUNTER — Other Ambulatory Visit (HOSPITAL_COMMUNITY): Payer: Self-pay

## 2020-07-23 LAB — COMPREHENSIVE METABOLIC PANEL
ALT: 12 U/L (ref 0–44)
AST: 16 U/L (ref 15–41)
Albumin: 3 g/dL — ABNORMAL LOW (ref 3.5–5.0)
Alkaline Phosphatase: 21 U/L — ABNORMAL LOW (ref 38–126)
Anion gap: 6 (ref 5–15)
BUN: 6 mg/dL (ref 6–20)
CO2: 23 mmol/L (ref 22–32)
Calcium: 8.6 mg/dL — ABNORMAL LOW (ref 8.9–10.3)
Chloride: 106 mmol/L (ref 98–111)
Creatinine, Ser: 0.56 mg/dL (ref 0.44–1.00)
GFR, Estimated: >60 mL/min (ref >60)
Glucose, Bld: 87 mg/dL (ref 70–99)
Potassium: 3.7 mmol/L (ref 3.5–5.1)
Sodium: 135 mmol/L (ref 135–145)
Total Bilirubin: 0.6 mg/dL (ref 0.3–1.2)
Total Protein: 4.6 g/dL — ABNORMAL LOW (ref 6.5–8.1)

## 2020-07-23 LAB — CBC
HCT: 29.1 % — ABNORMAL LOW (ref 36.0–46.0)
Hemoglobin: 9.6 g/dL — ABNORMAL LOW (ref 12.0–15.0)
MCH: 31 pg (ref 26.0–34.0)
MCHC: 33 g/dL (ref 30.0–36.0)
MCV: 93.9 fL (ref 80.0–100.0)
Platelets: 225 10*3/uL (ref 150–400)
RBC: 3.1 MIL/uL — ABNORMAL LOW (ref 3.87–5.11)
RDW: 13.4 % (ref 11.5–15.5)
WBC: 7 10*3/uL (ref 4.0–10.5)
nRBC: 0 % (ref 0.0–0.2)

## 2020-07-23 LAB — RETICULOCYTES
Immature Retic Fract: 7.3 % (ref 2.3–15.9)
RBC.: 3.14 MIL/uL — ABNORMAL LOW (ref 3.87–5.11)
Retic Count, Absolute: 60.9 10*3/uL (ref 19.0–186.0)
Retic Ct Pct: 1.9 % (ref 0.4–3.1)

## 2020-07-23 LAB — MAGNESIUM: Magnesium: 1.4 mg/dL — ABNORMAL LOW (ref 1.7–2.4)

## 2020-07-23 LAB — IRON AND TIBC
Iron: 75 ug/dL (ref 28–170)
Saturation Ratios: 30 % (ref 10.4–31.8)
TIBC: 249 ug/dL — ABNORMAL LOW (ref 250–450)
UIBC: 174 ug/dL

## 2020-07-23 LAB — FOLATE: Folate: 9.8 ng/mL (ref >5.9)

## 2020-07-23 LAB — VITAMIN B12: Vitamin B-12: 152 pg/mL — ABNORMAL LOW (ref 180–914)

## 2020-07-23 LAB — FERRITIN: Ferritin: 63 ng/mL (ref 11–307)

## 2020-07-23 MED ORDER — ONDANSETRON 8 MG PO TBDP
8.0000 mg | ORAL_TABLET | Freq: Three times a day (TID) | ORAL | 1 refills | Status: DC
  Filled 2020-07-23: qty 18, 6d supply, fill #0

## 2020-07-23 MED ORDER — ONDANSETRON HCL 4 MG PO TABS
4.0000 mg | ORAL_TABLET | Freq: Three times a day (TID) | ORAL | Status: DC
  Administered 2020-07-23: 4 mg via ORAL
  Filled 2020-07-23: qty 1

## 2020-07-23 MED ORDER — MAGNESIUM SULFATE 2 GM/50ML IV SOLN
2.0000 g | Freq: Once | INTRAVENOUS | Status: AC
  Administered 2020-07-23: 2 g via INTRAVENOUS
  Filled 2020-07-23: qty 50

## 2020-07-23 MED ORDER — DOXYLAMINE SUCCINATE (SLEEP) 25 MG PO TABS
25.0000 mg | ORAL_TABLET | Freq: Two times a day (BID) | ORAL | 1 refills | Status: AC
  Filled 2020-07-23: qty 5, 3d supply, fill #0

## 2020-07-23 MED ORDER — SCOPOLAMINE 1 MG/3DAYS TD PT72
1.0000 | MEDICATED_PATCH | TRANSDERMAL | 0 refills | Status: DC
  Filled 2020-07-23: qty 8, 24d supply, fill #0

## 2020-07-23 MED ORDER — PROMETHAZINE HCL 25 MG RE SUPP
25.0000 mg | Freq: Four times a day (QID) | RECTAL | 1 refills | Status: AC | PRN
  Filled 2020-07-23: qty 24, 6d supply, fill #0

## 2020-07-23 MED ORDER — FAMOTIDINE 20 MG PO TABS
20.0000 mg | ORAL_TABLET | Freq: Two times a day (BID) | ORAL | 3 refills | Status: AC
  Filled 2020-07-23: qty 60, 30d supply, fill #0

## 2020-07-23 MED ORDER — ONDANSETRON HCL 4 MG PO TABS
4.0000 mg | ORAL_TABLET | Freq: Three times a day (TID) | ORAL | 1 refills | Status: AC
  Filled 2020-07-23: qty 18, 6d supply, fill #0

## 2020-07-23 MED ORDER — DOCUSATE SODIUM 100 MG PO CAPS
100.0000 mg | ORAL_CAPSULE | Freq: Two times a day (BID) | ORAL | 0 refills | Status: DC | PRN
  Filled 2020-07-23: qty 60, 30d supply, fill #0

## 2020-07-23 MED ORDER — LACTATED RINGERS IV BOLUS
1000.0000 mL | Freq: Once | INTRAVENOUS | Status: AC
  Administered 2020-07-23: 1000 mL via INTRAVENOUS

## 2020-07-23 MED ORDER — FAMOTIDINE 20 MG PO TABS
20.0000 mg | ORAL_TABLET | Freq: Two times a day (BID) | ORAL | Status: DC
  Administered 2020-07-23: 20 mg via ORAL
  Filled 2020-07-23: qty 1

## 2020-07-23 MED ORDER — SCOPOLAMINE 1 MG/3DAYS TD PT72: 1.0000 | MEDICATED_PATCH | TRANSDERMAL | Status: DC

## 2020-07-23 MED ORDER — PYRIDOXINE HCL 25 MG PO TABS
25.0000 mg | ORAL_TABLET | Freq: Two times a day (BID) | ORAL | 1 refills | Status: AC
  Filled 2020-07-23: qty 60, 30d supply, fill #0

## 2020-07-23 MED ORDER — ONDANSETRON 4 MG PO TBDP: 4.0000 mg | ORAL_TABLET | Freq: Three times a day (TID) | ORAL | Status: DC

## 2020-07-23 MED ORDER — PROMETHAZINE HCL 25 MG PO TABS: 25.0000 mg | ORAL_TABLET | Freq: Four times a day (QID) | ORAL | Status: DC | PRN

## 2020-07-23 NOTE — Plan of Care (Signed)
Patient to be discharged home with printed instructions. No concerns noted. Aadhya Bustamante L Khamani Fairley, RN  

## 2020-07-23 NOTE — Progress Notes (Addendum)
Daily Antepartum Note  Admission Date: 07/20/2020 Current Date: 07/23/2020 9:09 AM  Tina Terry is a 25 y.o. G1@ [redacted]w[redacted]d, HD#3, admitted for acute on chronic n/v.  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   Objective:    Current Vital Signs 24h Vital Sign Ranges  T 98.3 F (36.8 C) Temp  Avg: 98.2 F (36.8 C)  Min: 97.8 F (36.6 C)  Max: 98.5 F (36.9 C)  BP (!) 80/50  BP  Min: 75/42  Max: 94/54  HR 66 Pulse  Avg: 70  Min: 64  Max: 77  RR 18 Resp  Avg: 17.7  Min: 16  Max: 18  SaO2 100 % Room Air SpO2  Avg: 99.9 %  Min: 99 %  Max: 100 %       24 Hour I/O Current Shift I/O  Time Ins Outs 07/07 0701 - 07/08 0700 In: 3199.6 [P.O.:780; I.V.:1908.9] Out: 1975 [Urine:1975] 07/08 0701 - 07/08 1900 In: -  Out: 200 [Urine:200]   Fetal Heart Tones: 145  Physical exam: General: Well nourished, well developed female in no acute distress. Abdomen: nttp Cardiovascular: S1, S2 normal, no murmur, rub or gallop, regular rate and rhythm Respiratory: CTAB Extremities: no clubbing, cyanosis or edema Skin: Warm and dry.   Medications: Current Facility-Administered Medications  Medication Dose Route Frequency Provider Last Rate Last Admin   dextrose 5 % in lactated ringers infusion   Intravenous Continuous Cassoday Bing, MD 100 mL/hr at 07/23/20 0428 New Bag at 07/23/20 0428   vitamin B-6 (pyridOXINE) tablet 25 mg  25 mg Oral BID Myna Hidalgo, DO   25 mg at 07/22/20 2121   And   doxylamine (Sleep) (UNISOM) tablet 25 mg  25 mg Oral BID Myna Hidalgo, DO   25 mg at 07/22/20 2121    famotidine (PEPCID) IVPB 20 mg premix  20 mg Intravenous Q12H Myna Hidalgo, DO 100 mL/hr at 07/22/20 2118 20 mg at 07/22/20 2118   magnesium sulfate IVPB 2 g 50 mL  2 g Intravenous Once Welcome Bing, MD       ondansetron (ZOFRAN-ODT) disintegrating tablet 4-8 mg  4-8 mg Oral Q8H Myna Hidalgo, DO       Or   ondansetron (ZOFRAN) injection 4 mg  4 mg Intravenous Q8H Myna Hidalgo, DO   4 mg at 07/23/20 0522   Or   ondansetron (ZOFRAN) 8 mg in sodium chloride 0.9 % 50 mL IVPB  8 mg Intravenous Q8H Ozan, Jennifer, DO       promethazine (PHENERGAN) tablet 12.5-25 mg  12.5-25 mg Oral Q4H PRN Myna Hidalgo, DO       Or   promethazine (PHENERGAN) suppository 12.5-25 mg  12.5-25 mg Rectal Q4H PRN Myna Hidalgo, DO       scopolamine (TRANSDERM-SCOP) 1 MG/3DAYS 1.5 mg  1 patch Transdermal Once Aviva Signs, CNM        Labs:  CBC Latest Ref Rng & Units 07/23/2020 06/08/2020 06/02/2020  WBC 4.0 - 10.5 K/uL 7.0 12.0(H) 11.8(H)  Hemoglobin 12.0 - 15.0 g/dL 6.7(E) 72.0 94.7  Hematocrit 36.0 - 46.0 % 29.1(L) 40.8  39.8  Platelets 150 - 400 K/uL 225 394 420(H)    CMP Latest Ref Rng & Units 07/23/2020 07/21/2020 07/04/2020  Glucose 70 - 99 mg/dL 87 585(I) 778(E)  BUN 6 - 20 mg/dL 6 13 16   Creatinine 0.44 - 1.00 mg/dL 4.23 5.36  Sodium 135 - 145 mmol/L 135 136 137  Potassium 3.5 - 5.1 mmol/L 3.7 3.6 3.4(L)  Chloride 98 - 111 mmol/L 106 101 103  CO2 22 - 32 mmol/L 23 20(L) 24  Calcium 8.9 - 10.3 mg/dL 1.44) 9.8 3.1(V  Total Protein 6.5 - 8.1 g/dL 4.6(L) - 7.3  Total Bilirubin 0.3 - 1.2 mg/dL 0.6 - 40.0)  Alkaline Phos 38 - 126 U/L 21(L) - 31(L)  AST 15 - 41 U/L 16 - 16  ALT 0 - 44 U/L 12 - 15   Mg: 1.4  Radiology:  No new imaging  Assessment & Plan:  Pt improving *Pregnancy: f/u qday FHTs *PPx: SCDs *FEN/GI: did well on full liquid. Will put on soft diet and transition to PO meds. Supplement with IV Mg *Anemia: panel ordered *Dispo: maybe late today. Pt missed new OB visit b/c  inpatient. Request sent to reschedule for next week.   8.6(P MD Attending Center for [facility]) GYN Consult Phone: [phone] (M-F, 0800-1700) & [phone] (Off hours, weekends, holidays)

## 2020-07-23 NOTE — Discharge Summary (Signed)
Physician Discharge Summary  Patient ID: Tina Terry MRN: [id number] DOB/AGE: Sep 18, 1995 25 y.o.  Admit date: 07/20/2020 Discharge date: 07/23/2020  Admission Diagnoses: Pregnancy at 13/3 weeks. Acute on chronic nausea/vomiting  Discharge Diagnoses: Pregnancy at 13/5 weeks. Resolving acute on chronic nausea/vomiting. B12 deficiency  Discharged Condition: good  Hospital Course: Patient admitted with above CC. She was admitted on IV medications and then transitioned to PO regimen. Daily fetal heart tones reassuring. Ferritin 62 so likely dilutional anemia. If patient able to take PO b12 at next visit, recommend starting this. If not, then will need injections.   07/16/20      46.3kg     07/21/20      44kg 07/22/20      45.9 kg  CMP Latest Ref Rng & Units 07/23/2020 07/21/2020 07/04/2020  Glucose 70 - 99 mg/dL 87 409(W) 119(J)  BUN 6 - 20 mg/dL 6 13 16   Creatinine 0.44 - 1.00 mg/dL 4.78 2.95  Sodium 135 - 145 mmol/L 135 136 137  Potassium 3.5 - 5.1 mmol/L 3.7 3.6 3.4(L)  Chloride 98 - 111 mmol/L 106 101 103  CO2 22 - 32 mmol/L 23 20(L) 24  Calcium 8.9 - 10.3 mg/dL 6.21) 9.8 3.0(Q  Total Protein 6.5 - 8.1 g/dL 4.6(L) - 7.3  Total Bilirubin 0.3 - 1.2 mg/dL 0.6 - 65.7)  Alkaline Phos 38 - 126 U/L 21(L) - 31(L)  AST 15 - 41 U/L 16 - 16  ALT 0 - 44 U/L 12 - 15   CBC Latest Ref Rng & Units 07/23/2020 06/08/2020 06/02/2020  WBC 4.0 - 10.5 K/uL 7.0 12.0(H) 11.8(H)  Hemoglobin 12.0 - 15.0 g/dL 06/04/2020) 9.6(E 95.2  Hematocrit 36.0 - 46.0 % 29.1(L) 40.8 39.8  Platelets 150 - 400 K/uL 225 394 420(H)   B12: 152   Discharge Exam: Blood pressure (!) 86/47, pulse 66, temperature 98 F (36.7 C), temperature source Oral, resp. rate 18, height 5\' 1"  (1.549 m), weight 47.6 kg, SpO2 100 %. General: Well nourished, well developed female in no acute distress. Abdomen: nttp Cardiovascular: S1, S2 normal, no murmur, rub or gallop, regular rate and rhythm Respiratory: CTAB Extremities: no clubbing, cyanosis  or edema Skin: Warm and dry.  Disposition:   Discharge Instructions     Discharge patient   Complete by: As directed    Discharge disposition: 01-Home or Self Care   Discharge patient date: 07/23/2020      Allergies as of 07/23/2020       Reactions   Reglan [metoclopramide] Anaphylaxis        Medication List     STOP taking these medications    Doxylamine-Pyridoxine 10-10 MG Tbec   metoCLOPramide 10 MG tablet Commonly known as: Reglan   ondansetron 8 MG disintegrating tablet Commonly known as: Zofran ODT       TAKE these medications    docusate sodium 100 MG capsule Commonly known as: Colace Take 1 capsule (100 mg total) by mouth 2 (two) times daily as needed for mild constipation. What changed:  how much to take when to take this reasons to take this   famotidine 20 MG tablet Commonly known as: PEPCID Take 1 tablet (20 mg total) by mouth 2 (two) times daily.   ondansetron 4 MG tablet Commonly known as: ZOFRAN Take 1 tablet (4 mg total) by mouth every 8 (eight) hours.   polyethylene glycol 17 g packet Commonly known as: MIRALAX / GLYCOLAX Take 17 g by mouth daily.   Prenatal Complete  14-0.4 MG Tabs Take 1 tablet by mouth daily.   promethazine 25 MG tablet Commonly known as: PHENERGAN Take 1 tablet (25 mg total) by mouth every 6 (six) hours as needed for nausea or vomiting.   promethazine 25 MG suppository Commonly known as: PHENERGAN Place 1 suppository (25 mg total) rectally every 6 (six) hours as needed for nausea or vomiting.   scopolamine 1 MG/3DAYS Commonly known as: TRANSDERM-SCOP Place 1 patch (1.5 mg total) onto the skin every 3 (three) days.   SM Sleep Aid 25 MG tablet Generic drug: doxylamine (Sleep) Take 1 tablet (25 mg total) by mouth 2 (two) times daily.   vitamin B-6 25 MG tablet Commonly known as: pyridOXINE Take 1 tablet (25 mg total) by mouth 2 (two) times daily.        Follow-up Information     [facility]. Schedule an appointment as soon as possible for a visit in 1 week(s).   Specialty: Obstetrics and Gynecology Why: call the office on tuesday afternoon if you have not heard about a follow up visit Contact information: [address] [place] [address] [phone]                Signed: Benson Bing 07/23/2020, 4:16 PM

## 2020-07-25 DIAGNOSIS — E538 Deficiency of other specified B group vitamins: Secondary | ICD-10-CM | POA: Clinically undetermined

## 2020-08-06 ENCOUNTER — Encounter: Payer: Self-pay | Admitting: *Deleted

## 2020-08-24 ENCOUNTER — Encounter: Payer: Self-pay | Admitting: General Practice

## 2020-08-24 ENCOUNTER — Encounter: Payer: Self-pay | Admitting: Advanced Practice Midwife

## 2020-08-24 ENCOUNTER — Ambulatory Visit (INDEPENDENT_AMBULATORY_CARE_PROVIDER_SITE_OTHER): Payer: Medicaid Other | Admitting: Advanced Practice Midwife

## 2020-08-24 ENCOUNTER — Other Ambulatory Visit (HOSPITAL_COMMUNITY)
Admission: RE | Admit: 2020-08-24 | Discharge: 2020-08-24 | Disposition: A | Discharge status: Home / Self Care | Payer: BC Managed Care – PPO | Source: Ambulatory Visit | Attending: Family Medicine | Admitting: Family Medicine

## 2020-08-24 ENCOUNTER — Other Ambulatory Visit: Payer: Self-pay

## 2020-08-24 VITALS — BP 104/65 | HR 93 | Wt 108.0 lb

## 2020-08-24 DIAGNOSIS — Z3400 Encounter for supervision of normal first pregnancy, unspecified trimester: Secondary | ICD-10-CM | POA: Insufficient documentation

## 2020-08-24 DIAGNOSIS — Z3402 Encounter for supervision of normal first pregnancy, second trimester: Secondary | ICD-10-CM | POA: Diagnosis present

## 2020-08-24 DIAGNOSIS — Z3A18 18 weeks gestation of pregnancy: Secondary | ICD-10-CM

## 2020-08-24 DIAGNOSIS — O210 Mild hyperemesis gravidarum: Secondary | ICD-10-CM

## 2020-08-24 LAB — POCT URINALYSIS DIPSTICK OB
Bilirubin, UA: NEGATIVE
Blood, UA: NEGATIVE
Ketones, UA: POSITIVE
Nitrite, UA: NEGATIVE
POC,PROTEIN,UA: NEGATIVE
Spec Grav, UA: 1.025 (ref 1.010–1.025)
Urobilinogen, UA: 0.2 E.U./dL
pH, UA: 6.5 (ref 5.0–8.0)

## 2020-08-24 MED ORDER — ASPIRIN 81 MG PO CHEW: 81.0000 mg | CHEWABLE_TABLET | Freq: Every day | ORAL | 5 refills | Status: AC

## 2020-08-24 NOTE — Progress Notes (Signed)
INITIAL OBSTETRICAL VISIT Patient name: Tina Terry MRN [id number]  Date of birth: [birth year] Chief Complaint:   Initial Prenatal Visit  History of Present Illness:   Tina Terry is a 25 y.o. G1P0 African American female at [redacted]w[redacted]d by LMP c/w u/s at 9 weeks with an Estimated Date of Delivery: 01/23/21 being seen today for her initial obstetrical visit.   Her obstetrical history is significant for  cannaboid hyperemesis with weight loss early on (now has gained 8 lbs) .   Today she reports no complaints.  Depression screen [facility] 2/9 08/24/2020  Decreased Interest 2  Down, Depressed, Hopeless 1  PHQ - 2 Score 3  Altered sleeping 3  Tired, decreased energy 3  Change in appetite 1  Feeling bad or failure about yourself  0  Trouble concentrating 0  Moving slowly or fidgety/restless 0  Suicidal thoughts 0  PHQ-9 Score 10    No LMP recorded. Patient is pregnant. Last pap never Review of Systems:   Pertinent items are noted in HPI Denies cramping/contractions, leakage of fluid, vaginal bleeding, abnormal vaginal discharge w/ itching/odor/irritation, headaches, visual changes, shortness of breath, chest pain, abdominal pain, severe nausea/vomiting, or problems with urination or bowel movements unless otherwise stated above.  Pertinent History Reviewed:  Reviewed past medical,surgical, social, obstetrical and family history.  Reviewed problem list, medications and allergies. OB History  Gravida Para Term Preterm AB Living  1            SAB IAB Ectopic Multiple Live Births               # Outcome Date GA Lbr Len/2nd Weight Sex Delivery Anes PTL Lv  1 Current            Physical Assessment:   Vitals:   08/24/20 0814  BP: 104/65  Pulse: 93  Weight: 108 lb (49 kg)  Body mass index is 20.41 kg/m.       Physical Examination:  General appearance - well appearing, and in no distress  Mental status - alert, oriented to person, place, and time  Psych:  She has a normal mood and  affect  Skin - warm and dry, normal color, no suspicious lesions noted  Chest - effort normal, all lung fields clear to auscultation bilaterally  Heart - normal rate and regular rhythm  Abdomen - soft, nontender  Extremities:  No swelling or varicosities noted  Pelvic - VULVA: normal appearing vulva with no masses, tenderness or lesions  VAGINA: normal appearing vagina with normal color and discharge, no lesions  CERVIX: normal appearing cervix without discharge or lesions, no CMT  Thin prep pap is done   Chaperone:  Lorelle Gibbs       No results found for this or any previous visit (from the past 24 hour(s)).  Assessment & Plan:  1) Low-Risk Pregnancy G1P0 at 106w2d with an Estimated Date of Delivery: 01/23/21   2) Initial OB visit  3) Hyperemesis, now doing much better, has gained weight  Meds: No orders of the defined types were placed in this encounter.   Initial labs obtained Continue prenatal vitamins Reviewed n/v relief measures and warning s/s to report Reviewed recommended weight gain based on pre-gravid BMI Encouraged well-balanced diet Genetic & carrier screening discussed: requests Panorama, requests Panorama Ultrasound discussed; fetal survey: requested CCNC completed> form faxed if has or is planning to apply for medicaid The nature of [facility] [facility] with multiple MDs and other Advanced  Practice Providers was explained to patient; also emphasized that fellows, residents, and students are part of our team.   Indications for ASA therapy (per uptodate) One of the following: H/O preeclampsia, especially early onset/adverse outcome No Multifetal gestation No CHTN No T1DM or T2DM No Chronic kidney disease No Autoimmune disease (antiphospholipid syndrome, systemic lupus erythematosus) No  OR Two or more of the following: Nulliparity Yes Obesity (BMI>30 kg/m2) No Family h/o preeclampsia in mother or sister No Age ?35 years  No Sociodemographic characteristics (African American race, low socioeconomic level) Yes Personal risk factors (eg, previous pregnancy w/ LBW or SGA, previous adverse pregnancy outcome [eg, stillbirth], interval >10 years between pregnancies) No  Indications for early A1C (per uptodate) BMI >=25 (>=23 in Asian women) AND one of the following GDM in a previous pregnancy No Previous A1C?5.7, impaired glucose tolerance, or impaired fasting glucose on previous testing No First-degree relative with diabetes No High-risk race/ethnicity (eg, African American, Latino, Native American, Asian American, Pacific Islander) Yes History of cardiovascular disease No HTN or on therapy for hypertension No HDL cholesterol level <35 mg/dL (6.75 mmol/L) and/or a triglyceride level >250 mg/dL (9.16 mmol/L) No PCOS No Physical inactivity No Other clinical condition associated with insulin resistance (eg, severe obesity, acanthosis nigricans) No Previous birth of an infant weighing ?4000 g No Previous stillbirth of unknown cause No >= 40yo No  Follow-up: 4 weeks  Will do initial blood work today Panorama/AFP today Schedule anatomy US soon  Wynelle Bourgeois CNM, [facility] 08/24/2020 9:11 AM

## 2020-08-25 LAB — CYTOLOGY - PAP
Adequacy: ABSENT
Chlamydia: NEGATIVE
Comment: NEGATIVE
Comment: NORMAL
Diagnosis: NEGATIVE
Neisseria Gonorrhea: NEGATIVE

## 2020-08-26 LAB — CBC/D/PLT+RPR+RH+ABO+RUBIGG...
Antibody Screen: NEGATIVE
Basophils Absolute: 0.1 10*3/uL (ref 0.0–0.2)
Basos: 1 %
EOS (ABSOLUTE): 0.2 10*3/uL (ref 0.0–0.4)
Eos: 2 %
HCV Ab: <0.1 s/co ratio (ref 0.0–0.9)
HIV Screen 4th Generation wRfx: NONREACTIVE
Hematocrit: 33.5 % — ABNORMAL LOW (ref 34.0–46.6)
Hemoglobin: 11.3 g/dL (ref 11.1–15.9)
Hepatitis B Surface Ag: NEGATIVE
Immature Grans (Abs): 0 10*3/uL (ref 0.0–0.1)
Immature Granulocytes: 0 %
Lymphocytes Absolute: 1.7 10*3/uL (ref 0.7–3.1)
Lymphs: 21 %
MCH: 30.4 pg (ref 26.6–33.0)
MCHC: 33.7 g/dL (ref 31.5–35.7)
MCV: 90 fL (ref 79–97)
Monocytes Absolute: 0.6 10*3/uL (ref 0.1–0.9)
Monocytes: 8 %
Neutrophils Absolute: 5.6 10*3/uL (ref 1.4–7.0)
Neutrophils: 68 %
Platelets: 309 10*3/uL (ref 150–450)
RBC: 3.72 x10E6/uL — ABNORMAL LOW (ref 3.77–5.28)
RDW: 12.4 % (ref 11.7–15.4)
RPR Ser Ql: NONREACTIVE
Rh Factor: POSITIVE
Rubella Antibodies, IGG: 3.03 index (ref >0.99)
WBC: 8.2 10*3/uL (ref 3.4–10.8)

## 2020-08-26 LAB — HCV INTERPRETATION

## 2020-08-27 LAB — CULTURE, OB URINE

## 2020-08-27 LAB — URINE CULTURE, OB REFLEX

## 2020-09-16 ENCOUNTER — Ambulatory Visit: Payer: BC Managed Care – PPO | Attending: Advanced Practice Midwife

## 2020-09-16 ENCOUNTER — Other Ambulatory Visit: Payer: Self-pay

## 2020-09-16 DIAGNOSIS — Z3A18 18 weeks gestation of pregnancy: Secondary | ICD-10-CM

## 2020-09-16 DIAGNOSIS — Z3A21 21 weeks gestation of pregnancy: Secondary | ICD-10-CM | POA: Diagnosis not present

## 2020-09-16 DIAGNOSIS — Z3689 Encounter for other specified antenatal screening: Secondary | ICD-10-CM | POA: Diagnosis not present

## 2020-09-16 DIAGNOSIS — Z363 Encounter for antenatal screening for malformations: Secondary | ICD-10-CM | POA: Diagnosis not present

## 2020-09-21 ENCOUNTER — Other Ambulatory Visit: Payer: Self-pay

## 2020-09-21 ENCOUNTER — Encounter: Payer: Self-pay | Admitting: Advanced Practice Midwife

## 2020-09-21 ENCOUNTER — Ambulatory Visit (INDEPENDENT_AMBULATORY_CARE_PROVIDER_SITE_OTHER): Payer: Medicaid Other | Admitting: Advanced Practice Midwife

## 2020-09-21 VITALS — BP 97/60 | HR 81 | Wt 116.0 lb

## 2020-09-21 DIAGNOSIS — Z3A22 22 weeks gestation of pregnancy: Secondary | ICD-10-CM

## 2020-09-21 NOTE — Progress Notes (Signed)
   PRENATAL VISIT NOTE  Subjective:  Tina Terry is a 25 y.o. G1P0 at [redacted]w[redacted]d being seen today for ongoing prenatal care.  She is currently monitored for the following issues for this low-risk pregnancy and has Nausea & vomiting; Hyperemesis affecting pregnancy, antepartum; Cannabis hyperemesis syndrome concurrent with and due to cannabis abuse (HCC); Nausea and vomiting in pregnancy; B12 deficiency; and Supervision of normal first pregnancy, antepartum on their problem list.  Patient reports no complaints and Feels so much better .  Contractions: Not present. Vag. Bleeding: None.  Movement: Present. Denies leaking of fluid.   The following portions of the patient's history were reviewed and updated as appropriate: allergies, current medications, past family history, past medical history, past social history, past surgical history and problem list.   Objective:   Vitals:   09/21/20 0922  BP: 97/60  Pulse: 81  Weight: 116 lb (52.6 kg)    Fetal Status: Fetal Heart Rate (bpm): 145   Movement: Present     General:  Alert, oriented and cooperative. Patient is in no acute distress.  Skin: Skin is warm and dry. No rash noted.   Cardiovascular: Normal heart rate noted  Respiratory: Normal respiratory effort, no problems with respiration noted  Abdomen: Soft, gravid, appropriate for gestational age.  Pain/Pressure: Absent     Pelvic: Cervical exam deferred        Extremities: Normal range of motion.  Edema: None  Mental Status: Normal mood and affect. Normal behavior. Normal judgment and thought content.   Assessment and Plan:  Pregnancy: G1P0 at [redacted]w[redacted]d 1. [redacted] weeks gestation of pregnancy      Reviewed normal [place] - AFP, Serum, Open Spina Bifida  Preterm labor symptoms and general obstetric precautions including but not limited to vaginal bleeding, contractions, leaking of fluid and fetal movement were reviewed in detail with the patient. Please refer to After Visit Summary for other  counseling recommendations.   Return in about 4 weeks (around 10/19/2020) for [employer].  Future Appointments  Date Time Provider Department Center  10/19/2020  8:35 AM Aviva Signs, CNM [facility] None  11/02/2020  9:15 AM Aviva Signs, CNM [facility] None    Wynelle Bourgeois, CNM

## 2020-09-23 LAB — AFP, SERUM, OPEN SPINA BIFIDA
AFP MoM: 0.76
AFP Value: 72 ng/mL
Gest. Age on Collection Date: 22.2 weeks
Maternal Age At EDD: 23.5 yr
OSBR Risk 1 IN: 10000
Test Results:: NEGATIVE
Weight: 116 [lb_av]

## 2020-10-19 ENCOUNTER — Other Ambulatory Visit: Payer: Self-pay

## 2020-10-19 ENCOUNTER — Ambulatory Visit (INDEPENDENT_AMBULATORY_CARE_PROVIDER_SITE_OTHER): Payer: BC Managed Care – PPO | Admitting: Advanced Practice Midwife

## 2020-10-19 ENCOUNTER — Encounter: Payer: Self-pay | Admitting: Advanced Practice Midwife

## 2020-10-19 ENCOUNTER — Encounter: Payer: Self-pay | Admitting: General Practice

## 2020-10-19 VITALS — BP 101/70 | HR 86 | Wt 122.1 lb

## 2020-10-19 DIAGNOSIS — Z23 Encounter for immunization: Secondary | ICD-10-CM

## 2020-10-19 DIAGNOSIS — O210 Mild hyperemesis gravidarum: Secondary | ICD-10-CM

## 2020-10-19 DIAGNOSIS — Z3400 Encounter for supervision of normal first pregnancy, unspecified trimester: Secondary | ICD-10-CM

## 2020-10-19 DIAGNOSIS — Z3A26 26 weeks gestation of pregnancy: Secondary | ICD-10-CM

## 2020-10-19 NOTE — Progress Notes (Signed)
   PRENATAL VISIT NOTE  Subjective:  Tina Terry is a 25 y.o. G1P0 at [redacted]w[redacted]d being seen today for ongoing prenatal care.  She is currently monitored for the following issues for this low-risk pregnancy and has Nausea & vomiting; Hyperemesis affecting pregnancy, antepartum; Cannabis hyperemesis syndrome concurrent with and due to cannabis abuse (HCC); Nausea and vomiting in pregnancy; B12 deficiency; and Supervision of normal first pregnancy, antepartum on their problem list.  Patient reports no bleeding and no cramping.  Contractions: Not present.  .  Movement: Present. Denies leaking of fluid.   The following portions of the patient's history were reviewed and updated as appropriate: allergies, current medications, past family history, past medical history, past social history, past surgical history and problem list.   Objective:   Vitals:   10/19/20 0848  BP: 101/70  Pulse: 86  Weight: 122 lb 1.3 oz (55.4 kg)    Fetal Status: Fetal Heart Rate (bpm): 155   Movement: Present     General:  Alert, oriented and cooperative. Patient is in no acute distress.  Skin: Skin is warm and dry. No rash noted.   Cardiovascular: Normal heart rate noted  Respiratory: Normal respiratory effort, no problems with respiration noted  Abdomen: Soft, gravid, appropriate for gestational age.  Pain/Pressure: Absent     Pelvic: Cervical exam deferred        Extremities: Normal range of motion.  Edema: None  Mental Status: Normal mood and affect. Normal behavior. Normal judgment and thought content.   Assessment and Plan:  Pregnancy: G1P0 at [redacted]w[redacted]d 1. Hyperemesis affecting pregnancy, antepartum      Resolved - CBC - Glucose Tolerance, 2 Hours w/1 Hour - HIV Antibody (routine testing w rflx) - RPR - Tdap vaccine greater than or equal to 7yo IM - Flu Vaccine QUAD 36+ mos IM (Fluarix, Quad PF)  2. Supervision of normal first pregnancy, antepartum  - CBC - Glucose Tolerance, 2 Hours w/1 Hour - HIV  Antibody (routine testing w rflx) - RPR - Tdap vaccine greater than or equal to 7yo IM - Flu Vaccine QUAD 36+ mos IM (Fluarix, Quad PF)  3. [redacted] weeks gestation of pregnancy      Discussed glucose test   Preterm labor symptoms and general obstetric precautions including but not limited to vaginal bleeding, contractions, leaking of fluid and fetal movement were reviewed in detail with the patient. Please refer to After Visit Summary for other counseling recommendations.   Return in about 2 weeks (around 11/02/2020) for [employer].  Future Appointments  Date Time Provider Department Center  11/02/2020  9:15 AM Aviva Signs, CNM [facility] None  11/16/2020  9:35 AM Aviva Signs, CNM [facility] None  11/23/2020  9:55 AM Aviva Signs, CNM [facility] None  12/07/2020 10:35 AM Gerrit Heck, CNM [facility] None    Wynelle Bourgeois, CNM

## 2020-10-20 LAB — GLUCOSE TOLERANCE, 2 HOURS W/ 1HR
Glucose, 1 hour: 130 mg/dL (ref 65–179)
Glucose, 2 hour: 110 mg/dL (ref 65–152)
Glucose, Fasting: 67 mg/dL (ref 65–91)

## 2020-10-20 LAB — CBC
Hematocrit: 33.9 % — ABNORMAL LOW (ref 34.0–46.6)
Hemoglobin: 11.2 g/dL (ref 11.1–15.9)
MCH: 31.5 pg (ref 26.6–33.0)
MCHC: 33 g/dL (ref 31.5–35.7)
MCV: 96 fL (ref 79–97)
Platelets: 306 10*3/uL (ref 150–450)
RBC: 3.55 x10E6/uL — ABNORMAL LOW (ref 3.77–5.28)
RDW: 12 % (ref 11.7–15.4)
WBC: 9.6 10*3/uL (ref 3.4–10.8)

## 2020-10-20 LAB — RPR: RPR Ser Ql: NONREACTIVE

## 2020-10-20 LAB — HIV ANTIBODY (ROUTINE TESTING W REFLEX): HIV Screen 4th Generation wRfx: NONREACTIVE

## 2020-11-02 ENCOUNTER — Encounter: Payer: Self-pay | Admitting: Advanced Practice Midwife

## 2020-11-02 ENCOUNTER — Other Ambulatory Visit: Payer: Self-pay

## 2020-11-02 ENCOUNTER — Ambulatory Visit (INDEPENDENT_AMBULATORY_CARE_PROVIDER_SITE_OTHER): Payer: Medicaid Other | Admitting: Advanced Practice Midwife

## 2020-11-02 VITALS — BP 98/64 | HR 87 | Wt 125.0 lb

## 2020-11-02 DIAGNOSIS — Z3A28 28 weeks gestation of pregnancy: Secondary | ICD-10-CM

## 2020-11-02 DIAGNOSIS — Z3400 Encounter for supervision of normal first pregnancy, unspecified trimester: Secondary | ICD-10-CM

## 2020-11-02 NOTE — Progress Notes (Signed)
   PRENATAL VISIT NOTE  Subjective:  Tina Terry is a 25 y.o. G1P0 at [redacted]w[redacted]d being seen today for ongoing prenatal care.  She is currently monitored for the following issues for this low-risk pregnancy and has Nausea & vomiting; B12 deficiency; and Supervision of normal first pregnancy, antepartum on their problem list.  Patient reports  occasional pain in right mid abdomen, usually singlular episode that resolves .  Contractions: Not present. Vag. Bleeding: None.  Movement: Present. Denies leaking of fluid.   The following portions of the patient's history were reviewed and updated as appropriate: allergies, current medications, past family history, past medical history, past social history, past surgical history and problem list.   Objective:   Vitals:   11/02/20 0918  BP: 98/64  Pulse: 87  Weight: 125 lb (56.7 kg)    Fetal Status: Fetal Heart Rate (bpm): 134   Movement: Present     General:  Alert, oriented and cooperative. Patient is in no acute distress.  Skin: Skin is warm and dry. No rash noted.   Cardiovascular: Normal heart rate noted  Respiratory: Normal respiratory effort, no problems with respiration noted  Abdomen: Soft, gravid, appropriate for gestational age.  Pain/Pressure: Present     Pelvic: Cervical exam deferred        Extremities: Normal range of motion.  Edema: None  Mental Status: Normal mood and affect. Normal behavior. Normal judgment and thought content.   Assessment and Plan:  Pregnancy: G1P0 at [redacted]w[redacted]d 1. [redacted] weeks gestation of pregnancy     Normal Glucola  2. Supervision of normal first pregnancy, antepartum   Preterm labor symptoms and general obstetric precautions including but not limited to vaginal bleeding, contractions, leaking of fluid and fetal movement were reviewed in detail with the patient. Please refer to After Visit Summary for other counseling recommendations.     Future Appointments  Date Time Provider Department Center   11/16/2020  9:35 AM Aviva Signs, CNM [facility] None  11/23/2020  9:55 AM Aviva Signs, CNM [facility] None  12/07/2020 10:35 AM Gerrit Heck, CNM [facility] None    Wynelle Bourgeois, CNM

## 2020-11-16 ENCOUNTER — Ambulatory Visit (INDEPENDENT_AMBULATORY_CARE_PROVIDER_SITE_OTHER): Payer: Medicaid Other | Admitting: Advanced Practice Midwife

## 2020-11-16 ENCOUNTER — Encounter: Payer: Self-pay | Admitting: Advanced Practice Midwife

## 2020-11-16 ENCOUNTER — Other Ambulatory Visit: Payer: Self-pay

## 2020-11-16 VITALS — BP 102/62 | HR 81 | Wt 131.0 lb

## 2020-11-16 DIAGNOSIS — Z3A30 30 weeks gestation of pregnancy: Secondary | ICD-10-CM

## 2020-11-16 NOTE — Progress Notes (Signed)
   PRENATAL VISIT NOTE  Subjective:  Tina Terry is a 25 y.o. G1P0 at [redacted]w[redacted]d being seen today for ongoing prenatal care.  She is currently monitored for the following issues for this low-risk pregnancy and has Nausea & vomiting; B12 deficiency; and Supervision of normal first pregnancy, antepartum on their problem list.  Patient reports no complaints.  Contractions: Not present. Vag. Bleeding: None.  Movement: Present. Denies leaking of fluid.   The following portions of the patient's history were reviewed and updated as appropriate: allergies, current medications, past family history, past medical history, past social history, past surgical history and problem list.   Objective:   Vitals:   11/16/20 0935  BP: 102/62  Pulse: 81  Weight: 131 lb (59.4 kg)    Fetal Status: Fetal Heart Rate (bpm): 135   Movement: Present     General:  Alert, oriented and cooperative. Patient is in no acute distress.  Skin: Skin is warm and dry. No rash noted.   Cardiovascular: Normal heart rate noted  Respiratory: Normal respiratory effort, no problems with respiration noted  Abdomen: Soft, gravid, appropriate for gestational age.  Pain/Pressure: Present     Pelvic: Cervical exam deferred        Extremities: Normal range of motion.  Edema: None  Mental Status: Normal mood and affect. Normal behavior. Normal judgment and thought content.   Assessment and Plan:  Pregnancy: G1P0 at [redacted]w[redacted]d 1. [redacted] weeks gestation of pregnancy     Doing well     Normal glucola     Occasional BH contractions     Good fetal movement  Preterm labor symptoms and general obstetric precautions including but not limited to vaginal bleeding, contractions, leaking of fluid and fetal movement were reviewed in detail with the patient. Please refer to After Visit Summary for other counseling recommendations.   Return in about 2 weeks (around 11/30/2020) for [employer].  Future Appointments  Date Time Provider  Department Center  11/23/2020  9:55 AM Aviva Signs, CNM [facility] None  12/07/2020 10:35 AM Gerrit Heck, CNM [facility] None    Wynelle Bourgeois, CNM

## 2020-11-23 ENCOUNTER — Ambulatory Visit (INDEPENDENT_AMBULATORY_CARE_PROVIDER_SITE_OTHER): Payer: Medicaid Other | Admitting: Advanced Practice Midwife

## 2020-11-23 ENCOUNTER — Other Ambulatory Visit: Payer: Self-pay

## 2020-11-23 VITALS — BP 109/67 | HR 93 | Wt 133.0 lb

## 2020-11-23 DIAGNOSIS — Z3400 Encounter for supervision of normal first pregnancy, unspecified trimester: Secondary | ICD-10-CM

## 2020-11-23 DIAGNOSIS — Z3A31 31 weeks gestation of pregnancy: Secondary | ICD-10-CM

## 2020-11-23 NOTE — Progress Notes (Signed)
   PRENATAL VISIT NOTE  Subjective:  Tina Terry is a 25 y.o. G1P0 at [redacted]w[redacted]d being seen today for ongoing prenatal care.  She is currently monitored for the following issues for this low-risk pregnancy and has Nausea & vomiting; B12 deficiency; and Supervision of normal first pregnancy, antepartum on their problem list.  Patient reports no complaints.  Contractions: Not present. Vag. Bleeding: None.  Movement: Present. Denies leaking of fluid.   The following portions of the patient's history were reviewed and updated as appropriate: allergies, current medications, past family history, past medical history, past social history, past surgical history and problem list.   Objective:   Vitals:   11/23/20 0958  BP: 109/67  Pulse: 93  Weight: 133 lb (60.3 kg)    Fetal Status: Fetal Heart Rate (bpm): 145   Movement: Present     General:  Alert, oriented and cooperative. Patient is in no acute distress.  Skin: Skin is warm and dry. No rash noted.   Cardiovascular: Normal heart rate noted  Respiratory: Normal respiratory effort, no problems with respiration noted  Abdomen: Soft, gravid, appropriate for gestational age.  Pain/Pressure: Present     Pelvic: Cervical exam deferred        Extremities: Normal range of motion.  Edema: None  Mental Status: Normal mood and affect. Normal behavior. Normal judgment and thought content.   Assessment and Plan:  Pregnancy: G1P0 at [redacted]w[redacted]d 1. Supervision of normal first pregnancy, antepartum      Doing well      Glucola normal  2. [redacted] weeks gestation of pregnancy   Preterm labor symptoms and general obstetric precautions including but not limited to vaginal bleeding, contractions, leaking of fluid and fetal movement were reviewed in detail with the patient. Please refer to After Visit Summary for other counseling recommendations.   Future Appointments  Date Time Provider Department Center  12/07/2020 10:35 AM Gerrit Heck, CNM [facility] None   12/21/2020 11:15 AM Aviva Signs, CNM [facility] None  12/28/2020 11:15 AM Aviva Signs, CNM [facility] None  01/04/2021 11:15 AM Aviva Signs, CNM [facility] None  01/13/2021 11:15 AM Levie Heritage, DO [facility] None    Wynelle Bourgeois, CNM

## 2020-12-07 ENCOUNTER — Other Ambulatory Visit: Payer: Self-pay

## 2020-12-07 ENCOUNTER — Ambulatory Visit (INDEPENDENT_AMBULATORY_CARE_PROVIDER_SITE_OTHER): Payer: Medicaid Other

## 2020-12-07 VITALS — BP 110/72 | HR 101 | Wt 135.0 lb

## 2020-12-07 DIAGNOSIS — R1011 Right upper quadrant pain: Secondary | ICD-10-CM

## 2020-12-07 DIAGNOSIS — Z3400 Encounter for supervision of normal first pregnancy, unspecified trimester: Secondary | ICD-10-CM

## 2020-12-07 DIAGNOSIS — Z3A33 33 weeks gestation of pregnancy: Secondary | ICD-10-CM

## 2020-12-07 DIAGNOSIS — G43109 Migraine with aura, not intractable, without status migrainosus: Secondary | ICD-10-CM

## 2020-12-07 MED ORDER — MAGNESIUM OXIDE -MG SUPPLEMENT 400 MG PO CAPS: 400.0000 mg | ORAL_CAPSULE | Freq: Every day | ORAL | 1 refills | Status: AC

## 2020-12-07 NOTE — Progress Notes (Signed)
LOW-RISK PREGNANCY OFFICE VISIT  Patient name: Tina Terry MRN [id number]  Date of birth: [birth year] Chief Complaint:   No chief complaint on file.  Subjective:   Tina Terry is a 25 y.o. G1P0 female at 68w2dwith an Estimated Date of Delivery: 01/23/21 being seen today for ongoing management of a low-risk pregnancy aeb has Nausea & vomiting; B12 deficiency; and Supervision of normal first pregnancy, antepartum on their problem list.  Patient presents today, with SO Rahim, and has c/o  RUQ pain and new onset migraines .  Patient states RUQ pain is constant when present and not relieved with peppermint oil or hot showers.  She states it "feels like my rib is puncturing my lung."  She reports inability to rest on her right side when the pain is present.   She states the pain starts suddenly, but is aggravated with touching the area.  Patient also reports new onset of migraines as she has a history, but has not experienced them during pregnancy.  She states she experiences sensitivity to light and black spots in her vision.  She states she does not treat her symptoms as she prefers not to use tylenol.  She states her symptoms are typical of previous migraines, but she would also have n/v with those.  She states the black spots sometimes appear prior to onset.  She states they resolve, spontaneously after implementing darkness (pillow over face) and 2-4 hours or rest.   Patient endorses fetal movement. Patient denies abdominal cramping or contractions.  Patient denies vaginal concerns including abnormal discharge, leaking of fluid, and bleeding.  Contractions: Not present. Vag. Bleeding: None.  Movement: Present.  Reviewed past medical,surgical, social, obstetrical and family history as well as problem list, medications and allergies.  Objective   Vitals:   12/07/20 1043  BP: 110/72  Pulse: (!) 101  Weight: 135 lb (61.2 kg)  Body mass index is 25.51 kg/m.  Total Weight Gain:15 lb (6.804  kg)         Physical Examination:   General appearance: Well appearing, and in no distress  Mental status: Alert, oriented to person, place, and time  Skin: Warm & dry  Cardiovascular: Normal heart rate noted  Respiratory: Normal respiratory effort, no distress  Abdomen: Soft, gravid, nontender, AGA with Fundal Height: 33 cm  Pelvic: Cervical exam deferred           Extremities: Edema: None  Fetal Status: Fetal Heart Rate (bpm): 140  Movement: Present   No results found for this or any previous visit (from the past 24 hour(s)).  Assessment & Plan:  Low-risk pregnancy of a 25y.o., G1P0 at 353w2dith an Estimated Date of Delivery: 01/23/21   1. Supervision of normal first pregnancy, antepartum -Anticipatory guidance for upcoming appts. -Patient to schedule next appt in 3 weeks for an in-person visit. -Reviewed GBS culture to be obtained at next visit.   2. [redacted] weeks gestation of pregnancy -Addressed complaints. -Encouraged to take childbirth class. -Brief discussion regarding pain medication available during labor.  -Encouraged to obtain pediatrician.  List provided in AVS.    3. Migraine with aura and without status migrainosus, not intractable -Discussed usage of tylenol.  4 doses of extra strength or 6 doses regular strength in 24 hour period. -Reassured that not uncommon for onset of migraines in 3rd trimester in those with history -Discussed and agreeable to treatment with Magnesium supplement.  -Rx for magnesium oxide 40044mo be taken daily sent to  pharmacy on file.  -Will obtain PreE labs for rule out.   4. Intermittent right upper quadrant abdominal pain -Encouraged to continue to monitor symptoms. -Informed that symptoms could be related to PreEclampsia especially in setting of migraine onset. -Instructed to not touch area especially if this triggers further discomfort. -Will obtain PreE labs for rule out.  -Encouraged usage of Tylenol and heating pads as needed.       Meds:  Meds ordered this encounter  Medications   Magnesium Oxide -Mg Supplement 400 MG CAPS    Sig: Take 400 mg by mouth daily.    Dispense:  60 capsule    Refill:  1    Order Specific Question:   Supervising Provider    Answer:   PRATT, TANYA S [[id number]]   Labs/procedures today:  Lab Orders         Protein / creatinine ratio, urine         CBC         Comp Met (CMET)       Reviewed: Preterm labor symptoms and general obstetric precautions including but not limited to vaginal bleeding, contractions, leaking of fluid and fetal movement were reviewed in detail with the patient.  All questions were answered.  Follow-up: Return in about 3 weeks (around 12/28/2020).  Orders Placed This Encounter  Procedures   Protein / creatinine ratio, urine   CBC   Comp Met (CMET)    L  MSN, CNM 12/07/2020  

## 2020-12-07 NOTE — Patient Instructions (Signed)
[email or web address]  GO TO THIS WEBSITE TO FIND AND SCHEDULE CHILDBIRTHING CLASSES NEXT ONLINE CLASS STARTS NOVEMBER 30TH   AREA PEDIATRIC/FAMILY PRACTICE PHYSICIANS  Central/[place]outheast [place] ([id number]) [facility] Deirdre Priest, MD; Lum Babe, MD; [place] Slider, MD; Leveda Anna, MD; McDiarmid, MD; Jerene Bears, MD; Jennette Kettle, MD; Gwendolyn Grant, MD [address] [place] [place], [place] [address] [phone] Mon-Fri 8:30-12:30, 1:30-5:00 Providers come to see babies at [facility] Accepting [facility] providers who accept newborns: Docia Chuck, MD; Kateri Plummer, MD; Paulino Rily, MD [address], [place], [place] [address] [phone] Mon-Fri 8:00-5:30 Babies seen by providers at [facility] Does NOT accept Medicaid Please call early in hospitalization for appointment (limited availability)  [facility] [place], MD [address] [place], [place] [address] [phone] Mon, Tue, Thur, Fri 8:30-5:00, Wed 10:00-7:00 (closed 1-2pm) Babies seen by [facility] providers Accepting Medicaid Donnie Coffin - Pediatrician Donnie Coffin, MD [address], [place], [place] [address] [phone] Mon-Fri 8:30-5:00, Sat 8:30-12:00 Provider comes to see babies at [facility] Accepting Medicaid Must have been referred from current patients or contacted office prior to delivery [facility]) Manson Passey, MD; Ave Filter, MD; Luna Fuse, MD; Kennedy Bucker, MD; Konrad Dolores, MD; Kathlene November, MD; Jenne Campus, MD; Lubertha South, MD; Wynetta Emery, MD; Duffy Rhody, MD; Gerre Couch, NP; Shirl Harris, NP [address] [place], [place], [place] [address] [phone] Mon, Halford Decamp, Thur, Fri 8:30-5:30, Wed 9:30-5:30, Sat 8:30-12:30 Babies seen by [facility] providers Accepting Medicaid Only accepting infants of first-time parents or siblings of current patients Hospital discharge coordinator will make follow-up  appointment Cyril Mourning [address] [place], [place]  [address] [phone]   Fax - [phone] [facility] [address], [place], [place]  [address] Phone - [phone]   Fax - [phone] Lucio Edward [address] [place], [place]  [address] [phone]  [place] [phone]) [facility] Jenne Pane, MD; Alita Chyle, MD; Princella Ion, MD; MD; Earlene Plater, MD; Jamesetta Orleans, MD; Alvera Novel, MD; Clarene Duke, MD; Rana Snare, MD; Carmon Ginsberg, MD; Alinda Money, MD; Hosie Poisson, MD; Mayford Knife, MD [address], [place], [place] [address] [phone] Mon-Fri 8:30-5:00 (extended evenings Mon-Thur as needed), Sat-Sun 10:00-1:00 Providers come to see babies at [facility] Accepting Medicaid for families of first-time babies and families with all children in the household age 7 and under. Must register with office prior to making appointment (M-F only). [facility] Suezanne Jacquet, NP; Lynelle Doctor, MD; Susann Givens, MD; [place], [place] [address] [place], [place] [address] [phone] Mon-Fri 8:00-5:00 Babies seen by providers at [facility] Does NOT accept Medicaid/Commercial Insurance Only [facility] at [place] ([facility])  Holly Bodily, MD; Zachery Dauer, MD; Stefan Church, MD; Sabino Dick, MD; Quitman Livings, MD; Farris Has, MD; Gaynell Face, MD; Betha Loa, MD; Colon Flattery, MD; Clifton James, MD [address] [place] [place], [place] [address] [phone] Mon-Fri 8:30-5:30, Sat (Oct.-Mar.) 9:00-1:00 Babies seen by providers at [facility] Accepting [facility]  [place] [phone]) [facility] Nestle, MD; Sheliah Hatch, MD [address], [place], [place] [address] [phone] Mon-Fri 8:30-5:00, Sat 8:30-12:00 Providers come to see babies at [facility] Does NOT accept [facility] at Lutricia Fe[place], [place]; Tracie Harrier, MD; [place], [place]; Wynelle Link, MD; Azucena Cecil, MD [address] [place], [place]  [address] [phone] Mon-Fri 8:00-5:00 Babies seen by providers at [facility] Does NOT accept Medicaid Only accepting babies of parents who are patients Please call early in hospitalization for appointment (limited availability) [facility] Pediatricians Chestine Spore, MD; Abran Cantor, MD; Early Osmond,  MD; Cherre Huger, NP; Hyacinth Meeker, MD; Dwan Bolt, MD; Jarold Motto, NP; Dario Guardian, MD; Talmage Nap, MD; Maisie Fus, MD; Pricilla Holm, MD; Tama High, MD [address] [place], [place], [place] [address] [phone] Mon-Fri 8:00-5:00, Sat 9:00-12:00 Providers come to see babies at [facility] Does NOT accept [facility] [phone]) [facility] at [facility] providers accepting new patients: Drema Pry, NP; Delena Serve, PA [address], [place], [place] [address] [phone] Mon-Fri 8:00-5:00 Babies seen by providers at [facility] Does NOT accept Medicaid Only accepting babies of parents who are patients Please call early in hospitalization for appointment (limited availability) [facility] Cardell Peach, MD; Nash Dimmer, MD [address] [place], [place] [address] [phone] (press 1 to schedule appointment) Mon-Fri 8:00-5:00 Providers come to see babies at [facility] Does NOT accept [facility], MD [address] [place], [place] [address] [phone] Mon-Fri 8:30-5:00 (lunch 12:30-1:00), extended hours by appointment only Wed 5:00-6:30 Babies seen by [facility] providers Accepting Medicaid [facility] Carmine, MD; [place], MD; Hassan Rowan, MD [address] [place], [place], [place] [address] [phone] Mon-Fri 8:00-5:00 Babies seen by [facility] providers Does NOT accept Medicaid [facility] Boykin Peek, MD; Durene Cal, MD; [place], [place] [address] [place], [place] [address] [phone] Mon-Fri 8:00-5:00 Babies seen by [facility] providers Does NOT accept [facility], [place]; [place], [place]; [place], [place]; Avis Epley, MD; Vonna Kotyk, MD; Clance Boll, MD; Stevphen [place], NP; Arvilla Market, NP; Ann Maki, NP; Otis Dials, NP; Vaughan Basta, MD; Eartha Inch, MD [address] [place], [place] [address] [phone] Mon-Fri 8:30-5:00, Sat 10:00-1:00 Providers come to see babies at [facility] Does NOT accept Medicaid Free prenatal information session Tuesdays at 4:45pm [facility] [place], MD; [place], [place]; [place], [place]; [place], [place] [address] [place] [address] [phone] Mon-Fri 7:30-5:30 Babies seen by [facility] providers [facility] Doctor [address], [place], [place]  [address] [phone]   Fax - [phone]  [place] [phone] & [phone]) [facility], MD [address], [place], [place] [address] [phone] Mon-Thur 8:00-6:00 Providers come to see babies at [facility] Accepting Medicaid [facility] Dareen Piano, NP; Cyndia Bent, MD; [place], [place]; [place], [place] [address] [place], [place] [address] [phone] Mon-Thur 7:30-7:30, Fri 7:30-4:30 Babies seen by [facility] providers Accepting [facility] Juanito Doom, MD; Janene [place], NP; Vonita Moss, MD [address], [place], [place] [address] [phone] Mon-Fri 8:30-5:00, Sat 8:30-12:00 Providers come to see babies at [facility] Accepting Medicaid Must have "Meet & Greet" appointment at office prior to delivery [facility]) Marlow Baars, MD; Earlene Plater, MD; Lucretia Roers, MD [address], [place], [place] [address] [phone] Mon-Wed 8:00-6:00, Thur-Fri 8:00-5:00, Sat 9:00-12:00 Providers come to see babies at [facility] Does NOT accept Medicaid Only accepting siblings of current patients [facility]  [address], [place], [place]  [address] [phone]   Fax  - [phone] [facility] Medicine at [facility] [address] [place] [address], [place], [place]  [address] [phone]   Fax - [phone]  [place] [phone] & [phone]) Adult nurse [facility], [place]; [place], [place] [address] [place], [place] [address] [phone] Mon-Fri 7:00-5:00 Babies seen by [facility] providers Does NOT accept Medicaid [facility] [place], MD; [place], [place]; [place], [place] [address], [place], [place] [address] [phone] Mon-Fri 8:00-5:00 Babies seen by [facility] providers Accepting Medicaid [facility] Family  Medicine - Ardeen Jourdain, MD; [place], [place]; [place], NP; [place], [place] [address] [place], [place], [place] [address] [phone] Mon-Fri 8:00-5:00 Babies seen by providers at [facility] Accepting [facility] [place] [phone]) [facility] Primary Care at [facility] [place], [place] [address] [place], [place] [address] [phone] Mon-Fri 8:00-5:00 Babies seen by [facility] providers Does NOT accept Medicaid Limited availability, please call early in hospitalization to schedule follow-up [facility] Jeanelle Malling, [place]; Eddie Candle, MD; Normand Sloop, MD; [place], [place]; Constance Goltz, MD; [place], [place] [address] [facility] [address], [place], [place] [address] [phone] Mon-Fri 8:30-5:00, Sat 9:00-12:00 Babies seen by providers at [facility] Accepting Medicaid Please register online then schedule online or call office [email or web address] - [facility] Family Medicine at Premier) Durene Cal, NP; Lucianne Muss, MD; [place], [place] [address], [place], [place] [address] [phone] Mon-Fri 8:00-5:00 Babies seen by providers at [facility] Accepting [facility] - [facility] ([facility] [place]) [place], MD; Reed Breech, NP; Shelva Majestic, MD [address], [place], [place] [address] [phone] Mon-Fri 8:00-5:30, Sat&Sun by appointment (phones open at 8:30) Babies seen by [facility] providers Accepting Medicaid Must be a first-time baby or sibling of current patient [facility]s - [place]  [address], [place], [place]  [address] [phone]   Fax - [phone]  [place] [address] [phone] & [phone]) [facility] Family Medicine [place], [place]; [place], [place]; [place], MD; [place], [place]; Carolyne Fiscal, MD [address] [place], [place] [address] [phone] Mon-Thur 8:00-7:00, Fri 8:00-5:00, Sat 8:00-12:00, Sun 9:00-12:00 Babies seen by [facility] providers Accepting Medicaid [facility] Family Medicine at Liana Gerold, MD; Gaynell Face, MD; [facility], MD [address], [place], [place] [address] [phone] Mon-Thur 8:00-5:00 Babies seen by providers at [facility] Accepting Medicaid [facility] Family Medicine at [place], MD; [place], MD; Madilyn Fireman, MD; Melvyn Neth, MD; [place], MD; Lazarus Salines, MD; Gaynell Face, MD; Berneda Rose, MD; Flora Lipps, MD; Beryl Meager, MD; Luther Redo, MD; Lavonia Drafts, MD; Kellie Simmering, MD [address] [place], [place] [address] [phone] Mon-Fri 8:00-5:30, Sat (Oct.-Mar.) 9:00-1:00 Babies seen by providers at [facility] Accepting Medicaid Must fill out new patient packet, available online at [email or web address] - [place] [facility] Pediatrics at [facility]) Spero Geralds, NP; Tiburcio Pea, NP; Tresa Endo, NP; Whitney Post, MD; [place], [place]; Hennie Duos, MD; [place], MD; Kavin Leech, NP [address], [place], [place] [address] [phone] Mon-Thur 8:00-5:30, Fri 8:00-5:00 Babies seen by providers at [facility] Accepting [facility]  [place] [phone]) [facility] [place], [place]; [place], MD; [place], MD; [place], [place] [address] [place], [place] [address] [phone] Mon-Fri  8:00-5:00 Babies seen by providers at [facility] Accepting [facility]   [place] [phone]) [facility], [place]; Lenise Arena, MD; [place], [place] [address], [place], [place] [address] [phone] Mon-Fri 8:00-5:00 Babies seen by providers at [facility] Does NOT accept Medicaid Limited appointment availability, please call early in hospitalization  [place] [facility], [place]; [place], MD [address], [place], [place] [address] [phone] Mon-Fri 8:00-5:00 Babies seen by [facility] providers Does NOT accept [facility], MD; Ninetta Lights, MD; [place], [place]; [place], MD [address], [place], [place] [address] [phone] Mon-Fri 8:00-5:00 After hours clinic [facility] [place], [place] [address]) [phone] Mon-Fri 5:00-8:00,  Sat 12:00-6:00, Sun 10:00-4:00 Babies seen by [facility] providers Accepting [facility] Family Medicine at [facility]. [address] [place], [place]  [address] [phone]   Fax - [phone]  [place] [phone]) Adult nurse Health[facility], MD [address] [place], [place], [place] [address] [phone] Mon-Fri 8:00-5:00 Babies seen by [facility] providers Does NOT accept Medicaid [facility] - [place] [facility] Family Practice at [place]) Rene Kocher, MD [address], [place], [place] [address] [phone] Mon-Thur 8:00-7:00, Fri 8:00-5:00, Sat 8:00-12:00 Babies seen by providers at [facility] Accepting Medicaid - but does not have vaccinations in office (must be received elsewhere) Limited availability, please call early in hospitalization  [place] [phone]) [facility]  Wyvonne Lenz, MD [address], [place] [address] [phone]  Fax [phone]  [facility]   Lyndel Safe, MD, [place], [place], [place], [place] [address] [place], [place] [address] [phone] [facility]  [address] [place]rian [place], [place] [address] [phone] [address] [place], [place] [address] [phone] [facility] Office)  [facility] [address], [place], [place] [address] [phone] [facility] [address] [place], [place], [place] [address] [phone] [facility] [address], [place], [place] [address] [phone] [facility] [address], [place], [place] [address] [phone] [facility] [address] [place], [place] [address] [phone] [facility] [address] [place], [place] [address] [phone] [facility]  [address], [place], [place] [address] [phone] Dr. Belia Heman. [place] [address] [place], [place] [address] [phone] [facility] [address], [address], [place], [place] [address] [phone] [facility] [address] [place], [place] [address] [phone]

## 2020-12-07 NOTE — Progress Notes (Signed)
Patient complaining of pain under her rib cage and migraines. Armandina Stammer RN

## 2020-12-08 ENCOUNTER — Telehealth: Payer: Self-pay

## 2020-12-08 LAB — CBC
Hematocrit: 33.5 % — ABNORMAL LOW (ref 34.0–46.6)
Hemoglobin: 11.3 g/dL (ref 11.1–15.9)
MCH: 30.1 pg (ref 26.6–33.0)
MCHC: 33.7 g/dL (ref 31.5–35.7)
MCV: 89 fL (ref 79–97)
Platelets: 293 10*3/uL (ref 150–450)
RBC: 3.76 x10E6/uL — ABNORMAL LOW (ref 3.77–5.28)
RDW: 12.4 % (ref 11.7–15.4)
WBC: 9.2 10*3/uL (ref 3.4–10.8)

## 2020-12-08 LAB — COMPREHENSIVE METABOLIC PANEL
ALT: 15 IU/L (ref 0–32)
AST: 18 IU/L (ref 0–40)
Albumin/Globulin Ratio: 1.5 (ref 1.2–2.2)
Albumin: 3.7 g/dL — ABNORMAL LOW (ref 3.9–5.0)
Alkaline Phosphatase: 153 IU/L — ABNORMAL HIGH (ref 44–121)
BUN/Creatinine Ratio: 16 (ref 9–23)
BUN: 9 mg/dL (ref 6–20)
Bilirubin Total: 0.2 mg/dL (ref 0.0–1.2)
CO2: 23 mmol/L (ref 20–29)
Calcium: 9 mg/dL (ref 8.7–10.2)
Chloride: 101 mmol/L (ref 96–106)
Creatinine, Ser: 0.58 mg/dL (ref 0.57–1.00)
Globulin, Total: 2.4 g/dL (ref 1.5–4.5)
Glucose: 80 mg/dL (ref 70–99)
Potassium: 4.4 mmol/L (ref 3.5–5.2)
Sodium: 137 mmol/L (ref 134–144)
Total Protein: 6.1 g/dL (ref 6.0–8.5)
eGFR: 130 mL/min/{1.73_m2} (ref >59)

## 2020-12-08 LAB — PROTEIN / CREATININE RATIO, URINE
Creatinine, Urine: 166.4 mg/dL
Protein, Ur: 39.7 mg/dL
Protein/Creat Ratio: 239 mg/g creat — ABNORMAL HIGH (ref 0–200)

## 2020-12-08 NOTE — Telephone Encounter (Signed)
Pt called to discuss her Albumin and Alkaline Phosphatase results. Pt made aware that we will call her to discuss results after the provider reviews her labs. Understanding was voiced. Tina Terry l Anahy Esh, CMA

## 2020-12-09 DIAGNOSIS — O1213 Gestational proteinuria, third trimester: Secondary | ICD-10-CM | POA: Insufficient documentation

## 2020-12-21 ENCOUNTER — Encounter: Payer: Medicaid Other | Admitting: Advanced Practice Midwife

## 2020-12-28 ENCOUNTER — Ambulatory Visit (INDEPENDENT_AMBULATORY_CARE_PROVIDER_SITE_OTHER): Payer: Medicaid Other | Admitting: Advanced Practice Midwife

## 2020-12-28 ENCOUNTER — Other Ambulatory Visit (HOSPITAL_COMMUNITY)
Admission: RE | Admit: 2020-12-28 | Discharge: 2020-12-28 | Disposition: A | Discharge status: Home / Self Care | Payer: BC Managed Care – PPO | Source: Ambulatory Visit | Attending: Advanced Practice Midwife | Admitting: Advanced Practice Midwife

## 2020-12-28 ENCOUNTER — Other Ambulatory Visit: Payer: Self-pay

## 2020-12-28 VITALS — BP 117/79 | HR 87 | Wt 146.0 lb

## 2020-12-28 DIAGNOSIS — Z3A36 36 weeks gestation of pregnancy: Secondary | ICD-10-CM | POA: Diagnosis not present

## 2020-12-28 DIAGNOSIS — Z3483 Encounter for supervision of other normal pregnancy, third trimester: Secondary | ICD-10-CM

## 2020-12-28 DIAGNOSIS — Z3493 Encounter for supervision of normal pregnancy, unspecified, third trimester: Secondary | ICD-10-CM | POA: Insufficient documentation

## 2020-12-28 NOTE — Progress Notes (Signed)
° °  PRENATAL VISIT NOTE  Subjective:  Tina Terry is a 25 y.o. G1P0 at [redacted]w[redacted]d being seen today for ongoing prenatal care.  She is currently monitored for the following issues for this low-risk pregnancy and has Nausea & vomiting; B12 deficiency; Supervision of normal first pregnancy, antepartum; and Proteinuria complicating pregnancy in third trimester on their problem list.  Patient reports no complaints.  Contractions: Not present. Vag. Bleeding: None.  Movement: Present. Denies leaking of fluid.   The following portions of the patient's history were reviewed and updated as appropriate: allergies, current medications, past family history, past medical history, past social history, past surgical history and problem list.   Objective:   Vitals:   12/28/20 1114  BP: 117/79  Pulse: 87  Weight: 146 lb (66.2 kg)    Fetal Status: Fetal Heart Rate (bpm): 145 Fundal Height: 36 cm Movement: Present  Presentation: Vertex  General:  Alert, oriented and cooperative. Patient is in no acute distress.  Skin: Skin is warm and dry. No rash noted.   Cardiovascular: Normal heart rate noted  Respiratory: Normal respiratory effort, no problems with respiration noted  Abdomen: Soft, gravid, appropriate for gestational age.  Pain/Pressure: Present     Pelvic: Cervical exam performed in the presence of a chaperone Dilation: Closed Effacement (%): 30 Station: -3  Extremities: Normal range of motion.  Edema: Trace  Mental Status: Normal mood and affect. Normal behavior. Normal judgment and thought content.   Assessment and Plan:  Pregnancy: G1P0 at [redacted]w[redacted]d 1. [redacted] weeks gestation of pregnancy      GBS and cultures done  Preterm labor symptoms and general obstetric precautions including but not limited to vaginal bleeding, contractions, leaking of fluid and fetal movement were reviewed in detail with the patient. Please refer to After Visit Summary for other counseling recommendations.     Future  Appointments  Date Time Provider Department Center  01/04/2021 11:15 AM Aviva Signs, CNM [facility] None  01/13/2021 11:15 AM Adrian Blackwater Rhona Raider, DO [facility] None    Wynelle Bourgeois, CNM

## 2020-12-29 LAB — GC/CHLAMYDIA PROBE AMP (~~LOC~~) NOT AT ARMC
Chlamydia: NEGATIVE
Comment: NEGATIVE
Comment: NORMAL
Neisseria Gonorrhea: NEGATIVE

## 2021-01-01 LAB — CULTURE, BETA STREP (GROUP B ONLY): Strep Gp B Culture: NEGATIVE

## 2021-01-02 ENCOUNTER — Inpatient Hospital Stay (HOSPITAL_COMMUNITY)
Admission: AD | Admit: 2021-01-02 | Discharge: 2021-01-05 | DRG: 807 | Disposition: A | Discharge status: Home / Self Care | Payer: BC Managed Care – PPO | Attending: Family Medicine | Admitting: Family Medicine

## 2021-01-02 ENCOUNTER — Other Ambulatory Visit: Payer: Self-pay

## 2021-01-02 ENCOUNTER — Encounter (HOSPITAL_COMMUNITY): Payer: Self-pay | Admitting: Obstetrics and Gynecology

## 2021-01-02 DIAGNOSIS — Z20822 Contact with and (suspected) exposure to covid-19: Secondary | ICD-10-CM | POA: Diagnosis present

## 2021-01-02 DIAGNOSIS — Z3400 Encounter for supervision of normal first pregnancy, unspecified trimester: Secondary | ICD-10-CM

## 2021-01-02 DIAGNOSIS — O1213 Gestational proteinuria, third trimester: Secondary | ICD-10-CM | POA: Diagnosis present

## 2021-01-02 DIAGNOSIS — O1214 Gestational proteinuria, complicating childbirth: Secondary | ICD-10-CM | POA: Diagnosis present

## 2021-01-02 DIAGNOSIS — Z3A37 37 weeks gestation of pregnancy: Secondary | ICD-10-CM | POA: Diagnosis not present

## 2021-01-02 DIAGNOSIS — O4202 Full-term premature rupture of membranes, onset of labor within 24 hours of rupture: Secondary | ICD-10-CM | POA: Diagnosis not present

## 2021-01-02 DIAGNOSIS — O26893 Other specified pregnancy related conditions, third trimester: Secondary | ICD-10-CM | POA: Diagnosis not present

## 2021-01-02 DIAGNOSIS — Z7982 Long term (current) use of aspirin: Secondary | ICD-10-CM

## 2021-01-02 LAB — CBC WITH DIFFERENTIAL/PLATELET
Abs Immature Granulocytes: 0.09 10*3/uL — ABNORMAL HIGH (ref 0.00–0.07)
Basophils Absolute: 0 10*3/uL (ref 0.0–0.1)
Basophils Relative: 0 %
Eosinophils Absolute: 0.2 10*3/uL (ref 0.0–0.5)
Eosinophils Relative: 1 %
HCT: 33.1 % — ABNORMAL LOW (ref 36.0–46.0)
Hemoglobin: 10.6 g/dL — ABNORMAL LOW (ref 12.0–15.0)
Immature Granulocytes: 1 %
Lymphocytes Relative: 16 %
Lymphs Abs: 1.8 10*3/uL (ref 0.7–4.0)
MCH: 29.1 pg (ref 26.0–34.0)
MCHC: 32 g/dL (ref 30.0–36.0)
MCV: 90.9 fL (ref 80.0–100.0)
Monocytes Absolute: 0.9 10*3/uL (ref 0.1–1.0)
Monocytes Relative: 8 %
Neutro Abs: 8.5 10*3/uL — ABNORMAL HIGH (ref 1.7–7.7)
Neutrophils Relative %: 74 %
Platelets: 319 10*3/uL (ref 150–400)
RBC: 3.64 MIL/uL — ABNORMAL LOW (ref 3.87–5.11)
RDW: 14.6 % (ref 11.5–15.5)
WBC: 11.5 10*3/uL — ABNORMAL HIGH (ref 4.0–10.5)
nRBC: 0 % (ref 0.0–0.2)

## 2021-01-02 LAB — PROTEIN / CREATININE RATIO, URINE
Creatinine, Urine: 148.53 mg/dL
Protein Creatinine Ratio: 0.11 mg/mg{Cre} (ref 0.00–0.15)
Total Protein, Urine: 17 mg/dL

## 2021-01-02 LAB — WET PREP, GENITAL
Clue Cells Wet Prep HPF POC: NONE SEEN
Sperm: NONE SEEN
Trich, Wet Prep: NONE SEEN
WBC, Wet Prep HPF POC: <10 (ref <10)
Yeast Wet Prep HPF POC: NONE SEEN

## 2021-01-02 LAB — AMNISURE RUPTURE OF MEMBRANE (ROM) NOT AT ARMC: Amnisure ROM: POSITIVE

## 2021-01-02 NOTE — MAU Note (Signed)
Pt reports two hours ago she was urinating and she stood up felt pressure in her abdomen and felt a gush of fluid.   Pt reports continuous leaking since that event.   Pt reports clear fluid.   Pt reports only feeling the baby more one time this morning. Pt reports trying to drink some juice a couple of hours ago, but the pt has still not felt the baby move.  Denies vaginal bleeding.

## 2021-01-03 ENCOUNTER — Inpatient Hospital Stay (HOSPITAL_COMMUNITY): Payer: BC Managed Care – PPO | Admitting: Anesthesiology

## 2021-01-03 ENCOUNTER — Encounter (HOSPITAL_COMMUNITY): Payer: Self-pay | Admitting: Obstetrics and Gynecology

## 2021-01-03 DIAGNOSIS — O1214 Gestational proteinuria, complicating childbirth: Secondary | ICD-10-CM | POA: Diagnosis present

## 2021-01-03 DIAGNOSIS — Z3A37 37 weeks gestation of pregnancy: Secondary | ICD-10-CM

## 2021-01-03 DIAGNOSIS — O4202 Full-term premature rupture of membranes, onset of labor within 24 hours of rupture: Secondary | ICD-10-CM

## 2021-01-03 DIAGNOSIS — Z7982 Long term (current) use of aspirin: Secondary | ICD-10-CM | POA: Diagnosis not present

## 2021-01-03 DIAGNOSIS — Z20822 Contact with and (suspected) exposure to covid-19: Secondary | ICD-10-CM | POA: Diagnosis present

## 2021-01-03 DIAGNOSIS — O26893 Other specified pregnancy related conditions, third trimester: Secondary | ICD-10-CM | POA: Diagnosis present

## 2021-01-03 LAB — COMPREHENSIVE METABOLIC PANEL
ALT: 22 U/L (ref 0–44)
AST: 22 U/L (ref 15–41)
Albumin: 2.9 g/dL — ABNORMAL LOW (ref 3.5–5.0)
Alkaline Phosphatase: 161 U/L — ABNORMAL HIGH (ref 38–126)
Anion gap: 6 (ref 5–15)
BUN: 7 mg/dL (ref 6–20)
CO2: 24 mmol/L (ref 22–32)
Calcium: 9.2 mg/dL (ref 8.9–10.3)
Chloride: 105 mmol/L (ref 98–111)
Creatinine, Ser: 0.61 mg/dL (ref 0.44–1.00)
GFR, Estimated: >60 mL/min (ref >60)
Glucose, Bld: 100 mg/dL — ABNORMAL HIGH (ref 70–99)
Potassium: 4.8 mmol/L (ref 3.5–5.1)
Sodium: 135 mmol/L (ref 135–145)
Total Bilirubin: 0.5 mg/dL (ref 0.3–1.2)
Total Protein: 6 g/dL — ABNORMAL LOW (ref 6.5–8.1)

## 2021-01-03 LAB — RPR: RPR Ser Ql: NONREACTIVE

## 2021-01-03 LAB — RESP PANEL BY RT-PCR (FLU A&B, COVID) ARPGX2
Influenza A by PCR: NEGATIVE
Influenza B by PCR: NEGATIVE
SARS Coronavirus 2 by RT PCR: NEGATIVE

## 2021-01-03 LAB — TYPE AND SCREEN
ABO/RH(D): A POS
Antibody Screen: NEGATIVE

## 2021-01-03 LAB — GC/CHLAMYDIA PROBE AMP (~~LOC~~) NOT AT ARMC
Chlamydia: NEGATIVE
Comment: NEGATIVE
Comment: NORMAL
Neisseria Gonorrhea: NEGATIVE

## 2021-01-03 MED ORDER — LIDOCAINE HCL (PF) 1 % IJ SOLN: 30.0000 mL | INTRAMUSCULAR | Status: DC | PRN

## 2021-01-03 MED ORDER — LACTATED RINGERS IV SOLN: INTRAVENOUS | Status: DC

## 2021-01-03 MED ORDER — OXYCODONE-ACETAMINOPHEN 5-325 MG PO TABS: 2.0000 | ORAL_TABLET | ORAL | Status: DC | PRN

## 2021-01-03 MED ORDER — LACTATED RINGERS IV SOLN: 500.0000 mL | Freq: Once | INTRAVENOUS | Status: DC

## 2021-01-03 MED ORDER — FENTANYL CITRATE (PF) 100 MCG/2ML IJ SOLN
100.0000 ug | INTRAMUSCULAR | Status: DC | PRN
  Administered 2021-01-03 (×4): 100 ug via INTRAVENOUS
  Filled 2021-01-03 (×4): qty 2

## 2021-01-03 MED ORDER — DIBUCAINE (PERIANAL) 1 % EX OINT: 1.0000 "application " | TOPICAL_OINTMENT | CUTANEOUS | Status: DC | PRN

## 2021-01-03 MED ORDER — OXYCODONE-ACETAMINOPHEN 5-325 MG PO TABS: 1.0000 | ORAL_TABLET | ORAL | Status: DC | PRN

## 2021-01-03 MED ORDER — LACTATED RINGERS IV SOLN: 500.0000 mL | INTRAVENOUS | Status: DC | PRN

## 2021-01-03 MED ORDER — SIMETHICONE 80 MG PO CHEW: 80.0000 mg | CHEWABLE_TABLET | ORAL | Status: DC | PRN

## 2021-01-03 MED ORDER — ONDANSETRON HCL 4 MG PO TABS: 4.0000 mg | ORAL_TABLET | ORAL | Status: DC | PRN

## 2021-01-03 MED ORDER — LIDOCAINE-EPINEPHRINE (PF) 2 %-1:200000 IJ SOLN
INTRAMUSCULAR | Status: DC | PRN
  Administered 2021-01-03: 5 mL via EPIDURAL

## 2021-01-03 MED ORDER — MISOPROSTOL 50MCG HALF TABLET
50.0000 ug | ORAL_TABLET | ORAL | Status: DC | PRN
  Administered 2021-01-03 (×2): 50 ug via ORAL
  Filled 2021-01-03 (×2): qty 1

## 2021-01-03 MED ORDER — BENZOCAINE-MENTHOL 20-0.5 % EX AERO
1.0000 "application " | INHALATION_SPRAY | CUTANEOUS | Status: DC | PRN
  Administered 2021-01-03 (×2): 1 via TOPICAL
  Filled 2021-01-03: qty 56

## 2021-01-03 MED ORDER — ONDANSETRON HCL 4 MG/2ML IJ SOLN
4.0000 mg | Freq: Four times a day (QID) | INTRAMUSCULAR | Status: DC | PRN
  Administered 2021-01-03: 11:00:00 4 mg via INTRAVENOUS
  Filled 2021-01-03: qty 2

## 2021-01-03 MED ORDER — ACETAMINOPHEN 325 MG PO TABS
650.0000 mg | ORAL_TABLET | ORAL | Status: DC | PRN
  Administered 2021-01-04 (×2): 650 mg via ORAL
  Filled 2021-01-03 (×2): qty 2

## 2021-01-03 MED ORDER — EPHEDRINE 5 MG/ML INJ: 10.0000 mg | INTRAVENOUS | Status: DC | PRN

## 2021-01-03 MED ORDER — PHENYLEPHRINE 40 MCG/ML (10ML) SYRINGE FOR IV PUSH (FOR BLOOD PRESSURE SUPPORT)
80.0000 ug | PREFILLED_SYRINGE | INTRAVENOUS | Status: DC | PRN
  Filled 2021-01-03: qty 10

## 2021-01-03 MED ORDER — DIPHENHYDRAMINE HCL 25 MG PO CAPS: 25.0000 mg | ORAL_CAPSULE | Freq: Four times a day (QID) | ORAL | Status: DC | PRN

## 2021-01-03 MED ORDER — PHENYLEPHRINE 40 MCG/ML (10ML) SYRINGE FOR IV PUSH (FOR BLOOD PRESSURE SUPPORT): 80.0000 ug | PREFILLED_SYRINGE | INTRAVENOUS | Status: DC | PRN

## 2021-01-03 MED ORDER — IBUPROFEN 600 MG PO TABS
600.0000 mg | ORAL_TABLET | Freq: Four times a day (QID) | ORAL | Status: DC
  Administered 2021-01-03 – 2021-01-05 (×8): 600 mg via ORAL
  Filled 2021-01-03 (×8): qty 1

## 2021-01-03 MED ORDER — OXYTOCIN BOLUS FROM INFUSION
333.0000 mL | Freq: Once | INTRAVENOUS | Status: AC
  Administered 2021-01-03: 16:00:00 333 mL via INTRAVENOUS

## 2021-01-03 MED ORDER — DIPHENHYDRAMINE HCL 50 MG/ML IJ SOLN: 12.5000 mg | INTRAMUSCULAR | Status: DC | PRN

## 2021-01-03 MED ORDER — ACETAMINOPHEN 325 MG PO TABS: 650.0000 mg | ORAL_TABLET | ORAL | Status: DC | PRN

## 2021-01-03 MED ORDER — WITCH HAZEL-GLYCERIN EX PADS: 1.0000 "application " | MEDICATED_PAD | CUTANEOUS | Status: DC | PRN

## 2021-01-03 MED ORDER — FENTANYL-BUPIVACAINE-NACL 0.5-0.125-0.9 MG/250ML-% EP SOLN
12.0000 mL/h | EPIDURAL | Status: DC | PRN
  Administered 2021-01-03: 12:00:00 12 mL/h via EPIDURAL
  Filled 2021-01-03: qty 250

## 2021-01-03 MED ORDER — FLEET ENEMA 7-19 GM/118ML RE ENEM: 1.0000 | ENEMA | RECTAL | Status: DC | PRN

## 2021-01-03 MED ORDER — SOD CITRATE-CITRIC ACID 500-334 MG/5ML PO SOLN: 30.0000 mL | ORAL | Status: DC | PRN

## 2021-01-03 MED ORDER — ZOLPIDEM TARTRATE 5 MG PO TABS: 5.0000 mg | ORAL_TABLET | Freq: Every evening | ORAL | Status: DC | PRN

## 2021-01-03 MED ORDER — TERBUTALINE SULFATE 1 MG/ML IJ SOLN
0.2500 mg | Freq: Once | INTRAMUSCULAR | Status: DC | PRN
  Filled 2021-01-03: qty 1

## 2021-01-03 MED ORDER — TETANUS-DIPHTH-ACELL PERTUSSIS 5-2.5-18.5 LF-MCG/0.5 IM SUSY: 0.5000 mL | PREFILLED_SYRINGE | Freq: Once | INTRAMUSCULAR | Status: DC

## 2021-01-03 MED ORDER — ONDANSETRON HCL 4 MG/2ML IJ SOLN: 4.0000 mg | INTRAMUSCULAR | Status: DC | PRN

## 2021-01-03 MED ORDER — SENNOSIDES-DOCUSATE SODIUM 8.6-50 MG PO TABS
2.0000 | ORAL_TABLET | Freq: Every day | ORAL | Status: DC
  Administered 2021-01-04 – 2021-01-05 (×2): 2 via ORAL
  Filled 2021-01-03 (×2): qty 2

## 2021-01-03 MED ORDER — PRENATAL MULTIVITAMIN CH
1.0000 | ORAL_TABLET | Freq: Every day | ORAL | Status: DC
  Administered 2021-01-04 – 2021-01-05 (×2): 1 via ORAL
  Filled 2021-01-03 (×2): qty 1

## 2021-01-03 MED ORDER — OXYTOCIN-SODIUM CHLORIDE 30-0.9 UT/500ML-% IV SOLN
2.5000 [IU]/h | INTRAVENOUS | Status: DC
  Filled 2021-01-03: qty 500

## 2021-01-03 MED ORDER — COCONUT OIL OIL: 1.0000 "application " | TOPICAL_OIL | Status: DC | PRN

## 2021-01-03 NOTE — Discharge Summary (Signed)
Postpartum Discharge Summary      Patient Name: Tina Terry DOB: Jun 16, 1995 MRN: [id number]  Date of admission: 01/02/2021 Delivery date:01/03/2021  Delivering provider: Laury Deep  Date of discharge: 01/05/2021  Admitting diagnosis: Normal labor [O80, Z37.9] Intrauterine pregnancy: [redacted]w[redacted]d    Secondary diagnosis:  Principal Problem:   Normal labor Active Problems:   Supervision of normal first pregnancy, antepartum   Proteinuria complicating pregnancy in third trimester  Additional problems: none    Discharge diagnosis: Term Pregnancy Delivered                                              Post partum procedures: None Augmentation: AROM Complications: None  Hospital course: Onset of Labor With Vaginal Delivery      25y.o. yo G1P0 at 340w1das admitted in Latent Labor on 01/02/2021. Patient had an uncomplicated labor course as follows:  Membrane Rupture Time/Date: 8:50 PM ,01/02/2021   Delivery Method:Vaginal, Spontaneous  Episiotomy: None  Lacerations:  None  Patient had an uncomplicated postpartum course.  She is ambulating, tolerating a regular diet, passing flatus, and urinating well. Patient is discharged home in stable condition on 01/05/21.  Newborn Data: Birth date:01/03/2021  Birth time:3:58 PM  Gender:Female  Living status:Living  Apgars:9 ,9  Weight:2630 g   Magnesium Sulfate received: No BMZ received: No Rhophylac:N/A MMR:N/A T-DaP:Given prenatally Flu: Yes - prenatally Transfusion:No  Physical exam  Vitals:   01/04/21 0625 01/04/21 1500 01/04/21 2200 01/05/21 0550  BP: 101/62 108/77 118/77 101/70  Pulse: 81 70 95 90  Resp: 17 16 17 18   Temp: 97.9 F (36.6 C) 97.8 F (36.6 C) 98.2 F (36.8 C) 98 F (36.7 C)  TempSrc: Oral Oral Oral Oral  SpO2: 98% 99% 100% 100%   General: alert Lochia: appropriate Uterine Fundus: firm Incision: Healing well with no significant drainage DVT Evaluation: No evidence of DVT seen on physical  exam. Labs: Lab Results  Component Value Date   WBC 11.5 (H) 01/02/2021   HGB 10.6 (L) 01/02/2021   HCT 33.1 (L) 01/02/2021   MCV 90.9 01/02/2021   PLT 319 01/02/2021   CMP Latest Ref Rng & Units 01/02/2021  Glucose 70 - 99 mg/dL 100(H)  BUN 6 - 20 mg/dL 7  Creatinine 0.44 - 1.00 mg/dL 0.61  Sodium 135 - 145 mmol/L 135  Potassium 3.5 - 5.1 mmol/L 4.8  Chloride 98 - 111 mmol/L 105  CO2 22 - 32 mmol/L 24  Calcium 8.9 - 10.3 mg/dL 9.2  Total Protein 6.5 - 8.1 g/dL 6.0(L)  Total Bilirubin 0.3 - 1.2 mg/dL 0.5  Alkaline Phos 38 - 126 U/L 161(H)  AST 15 - 41 U/L 22  ALT 0 - 44 U/L 22   Edinburgh Score: Edinburgh Postnatal Depression Scale Screening Tool 01/03/2021  I have been able to laugh and see the funny side of things. 0  I have looked forward with enjoyment to things. 0  I have blamed myself unnecessarily when things went wrong. 0  I have been anxious or worried for no good reason. 0  I have felt scared or panicky for no good reason. 0  Things have been getting on top of me. 1  I have been so unhappy that I have had difficulty sleeping. 0  I have felt sad or miserable. 1  I have been so unhappy  that I have been crying. 0  The thought of harming myself has occurred to me. 0  Edinburgh Postnatal Depression Scale Total 2     After visit meds:  Allergies as of 01/05/2021       Reactions   Reglan [metoclopramide] Anaphylaxis        Medication List     TAKE these medications    acetaminophen 500 MG tablet Commonly known as: TYLENOL Take 500 mg by mouth every 6 (six) hours as needed for headache or mild pain. What changed: Another medication with the same name was added. Make sure you understand how and when to take each.   acetaminophen 325 MG tablet Commonly known as: Tylenol Take 2 tablets (650 mg total) by mouth every 4 (four) hours as needed (for pain scale < 4). What changed: You were already taking a medication with the same name, and this prescription  was added. Make sure you understand how and when to take each.   aspirin 81 MG chewable tablet Chew 1 tablet (81 mg total) by mouth daily.   famotidine 20 MG tablet Commonly known as: PEPCID Take 1 tablet (20 mg total) by mouth 2 (two) times daily.   ibuprofen 600 MG tablet Commonly known as: ADVIL Take 1 tablet (600 mg total) by mouth every 6 (six) hours.   Magnesium Oxide -Mg Supplement 400 MG Caps Take 400 mg by mouth daily.   ondansetron 4 MG tablet Commonly known as: ZOFRAN Take 1 tablet (4 mg total) by mouth every 8 (eight) hours.   Prenatal Complete 14-0.4 MG Tabs Take 1 tablet by mouth daily.   promethazine 25 MG suppository Commonly known as: PHENERGAN Place 1 suppository (25 mg total) rectally every 6 (six) hours as needed for nausea or vomiting.   SM Sleep Aid 25 MG tablet Generic drug: doxylamine (Sleep) Take 1 tablet (25 mg total) by mouth 2 (two) times daily.   vitamin B-6 25 MG tablet Commonly known as: pyridOXINE Take 1 tablet (25 mg total) by mouth 2 (two) times daily.         Discharge home in stable condition Infant Feeding: Breast Infant Disposition:home with mother Discharge instruction: per After Visit Summary and Postpartum booklet. Activity: Advance as tolerated. Pelvic rest for 6 weeks.  Diet: routine diet Future Appointments: Future Appointments  Date Time Provider [place]  02/01/2021 10:55 AM Seabron Spates, CNM [facility] None   Follow up Visit: Please schedule this patient for a In person postpartum visit in 6 weeks with the following provider: Any provider. Additional Postpartum F/U: none   Low risk pregnancy complicated by:  none Delivery mode:  Vaginal, Spontaneous  Anticipated Birth Control:  Unsure   01/05/2021 Renard Matter, MD

## 2021-01-03 NOTE — Anesthesia Procedure Notes (Signed)
Epidural Patient location during procedure: OB Start time: 01/03/2021 12:00 PM End time: 01/03/2021 12:10 PM  Staffing Anesthesiologist: Elmer Picker, MD Performed: anesthesiologist   Preanesthetic Checklist Completed: patient identified, IV checked, risks and benefits discussed, monitors and equipment checked, pre-op evaluation and timeout performed  Epidural Patient position: sitting Prep: DuraPrep and site prepped and draped Patient monitoring: continuous pulse ox, blood pressure, heart rate and cardiac monitor Approach: midline Location: L3-L4 Injection technique: LOR air  Needle:  Needle type: Tuohy  Needle gauge: 17 G Needle length: 9 cm Needle insertion depth: 4.5 cm Catheter type: closed end flexible Catheter size: 19 Gauge Catheter at skin depth: 10 cm Test dose: negative  Assessment Sensory level: T8 Events: blood not aspirated, injection not painful, no injection resistance, no paresthesia and negative IV test  Additional Notes Patient identified. Risks/Benefits/Options discussed with patient including but not limited to bleeding, infection, nerve damage, paralysis, failed block, incomplete pain control, headache, blood pressure changes, nausea, vomiting, reactions to medication both or allergic, itching and postpartum back pain. Confirmed with bedside nurse the patient's most recent platelet count. Confirmed with patient that they are not currently taking any anticoagulation, have any bleeding history or any family history of bleeding disorders. Patient expressed understanding and wished to proceed. All questions were answered. Sterile technique was used throughout the entire procedure. Please see nursing notes for vital signs. Test dose was given through epidural catheter and negative prior to continuing to dose epidural or start infusion. Warning signs of high block given to the patient including shortness of breath, tingling/numbness in hands, complete motor  block, or any concerning symptoms with instructions to call for help. Patient was given instructions on fall risk and not to get out of bed. All questions and concerns addressed with instructions to call with any issues or inadequate analgesia.  Reason for block:procedure for pain

## 2021-01-03 NOTE — Progress Notes (Addendum)
Tina Terry is a 25 y.o. G1P0 at [redacted]w[redacted]d by LMP admitted for rupture of membranes  Subjective: Called to the room by RN for FHR decel x 4 minutes. Patient had just received a dose of Fentanyl. FOB supportive at bedside. RN x 3 at bedside.  Objective: BP 106/74    Pulse 78    Temp 98.7 F (37.1 C) (Oral)    Resp 17    SpO2 99%  No intake/output data recorded. No intake/output data recorded.  FHT:  FHR: 130 bpm, variability: minimal/moderate,  accelerations:  Present,  decelerations:  Present prolonged decel UC:   regular, every 2 minutes SVE:   Dilation: 5 Effacement (%): 100 Station: -1, -2 Exam by:: Raelyn Mora, RN BBOW- forebag  Labs: Lab Results  Component Value Date   WBC 11.5 (H) 01/02/2021   HGB 10.6 (L) 01/02/2021   HCT 33.1 (L) 01/02/2021   MCV 90.9 01/02/2021   PLT 319 01/02/2021    Assessment / Plan: Spontaneous labor, progressing normally  Labor: Progressing normally Preeclampsia:   n/a Fetal Wellbeing:  Category I Pain Control:  IV pain meds, I/D:  n/a Anticipated MOD:  NSVD  Discussed getting epidural now and then AROM forebag once comfortable. Pt agreeable to that plan. Raelyn Mora, CNM 01/03/2021, 10:47 AM

## 2021-01-03 NOTE — Lactation Note (Signed)
This note was copied from a baby's chart. Lactation Consultation Note  Patient Name: Tina Terry [id number] Date: 01/03/2021 Reason for consult: Initial assessment;Primapara;1st time breastfeeding;Early term 37-38.6wks;Infant < 6lbs;Breastfeeding assistance Age:23 hours  LC assisted to get a deep latch with nipple role and tea cup hold in football position.  Infant increase depth of swallows noted with breast compression.   Plan 1. To feed based on cues 8-12x 24hr period. Mom to offer breasts and look for sign of milk transfer.  2. Mom to supplement with EBM 5-10 ml per feeding. Mom try curve tip with finger feeding, prefers to not use a bottle. LC spoke to RN, Arletha Pili to go over finger feeding with curve tip with next feeding. Infant struggled with spoon feeding at this visit.  3. DEBP q 3hrs for 15 min  4 I and O sheet reviewed.  All questions answered at the end of the visit.   Maternal Data Has patient been taught Hand Expression?: Yes Does the patient have breastfeeding experience prior to this delivery?: No  Feeding Mother's Current Feeding Choice: Breast Milk  LATCH Score Latch: Grasps breast easily, tongue down, lips flanged, rhythmical sucking. (Mom to do a nipple role and tea cup hold to get a deep latch)  Audible Swallowing: Spontaneous and intermittent  Type of Nipple: Inverted (nipples will evert with stimulation)  Comfort (Breast/Nipple): Soft / non-tender  Hold (Positioning): Assistance needed to correctly position infant at breast and maintain latch.  LATCH Score: 7   Lactation Tools Discussed/Used Tools: Pump;Flanges Flange Size: 21 Breast pump type: Double-Electric Breast Pump Pump Education: Setup, frequency, and cleaning;Milk Storage Reason for Pumping: increase stimulation Pumping frequency: every 3 hrs for 15 min  Interventions Interventions: Breast feeding basics reviewed;Assisted with latch;Skin to skin;Breast massage;Hand express;Breast  compression;Adjust position;Support pillows;Position options;Expressed milk;Hand pump;DEBP;Education;LC Services brochure;Infant Driven Feeding Algorithm education  Discharge Pump: Personal WIC Program: No  Consult Status Consult Status: Follow-up Date: 01/04/21 Follow-up type: In-patient    Khyrin Trevathan  Nicholson-Springer 01/03/2021, 8:45 PM

## 2021-01-03 NOTE — Progress Notes (Signed)
Tina Terry is a 25 y.o. G1P0 at [redacted]w[redacted]d by LMP admitted for rupture of membranes  Subjective: Resting comfortably in bed. Planning to wait as long as possible before getting an epidural.  Objective: BP 126/81    Pulse 83    Temp 98.7 F (37.1 C) (Oral)    Resp 16    SpO2 99%  No intake/output data recorded. No intake/output data recorded.  FHT:  FHR: 130 bpm, variability: moderate,  accelerations:  Present,  decelerations:  Absent UC:   regular, every 2-3 minutes SVE:   Dilation: 3 Effacement (%): 50 Station: -2 Exam by:: Blenda Mounts, RN  Labs: Lab Results  Component Value Date   WBC 11.5 (H) 01/02/2021   HGB 10.6 (L) 01/02/2021   HCT 33.1 (L) 01/02/2021   MCV 90.9 01/02/2021   PLT 319 01/02/2021    Assessment / Plan: Spontaneous labor, progressing normally  Labor: Progressing normally Preeclampsia:   n/a Fetal Wellbeing:  Category I Pain Control:  Labor support without medications I/D:   GBS Neg Anticipated MOD:  NSVD  Raelyn Mora, CNM 01/03/2021, 9:53 AM

## 2021-01-03 NOTE — Lactation Note (Signed)
This note was copied from a baby's chart. Lactation Consultation Note  Patient Name: Tina Terry ALPFX'T Date: 01/03/2021 Reason for consult: Initial assessment;Primapara;1st time breastfeeding;Early term 37-38.6wks;Infant < 6lbs;Breastfeeding assistance Age:23 hours   LC assisted with latching infant in football with signs of milk transfer. Infant fed 3.5 ml of EBM on spoon.   LC reviewed LPTI guidelines to reduce calorie loss including keeping total feeding under 30 min.   Plan 1.to feed based on cues 8-12x 24hr period. Mom to offer breasts and look for signs of milk transfer.  2. Mom to supplement with eBM 5-10 ml after latching with curve tip and finger feeding, RN to alerted to assist.  3. DEBP q 3hrs for 15 min  4. I and O sheet reviewed.  All questions answered at the end of the visit.  Maternal Data Has patient been taught Hand Expression?: Yes Does the patient have breastfeeding experience prior to this delivery?: No  Feeding Mother's Current Feeding Choice: Breast Milk  LATCH Score Latch: Grasps breast easily, tongue down, lips flanged, rhythmical sucking. (Mom to do a nipple role and tea cup hold to get a deep latch)  Audible Swallowing: Spontaneous and intermittent  Type of Nipple: Inverted (nipples will evert with stimulation)  Comfort (Breast/Nipple): Soft / non-tender  Hold (Positioning): Assistance needed to correctly position infant at breast and maintain latch.  LATCH Score: 7   Lactation Tools Discussed/Used Tools: Pump;Flanges Flange Size: 21 Breast pump type: Double-Electric Breast Pump Pump Education: Setup, frequency, and cleaning;Milk Storage Reason for Pumping: increase stimulation Pumping frequency: every 3 hrs for 15 min  Interventions Interventions: Breast feeding basics reviewed;Assisted with latch;Skin to skin;Breast massage;Hand express;Breast compression;Adjust position;Support pillows;Position options;Expressed milk;Hand  pump;DEBP;Education;LC Services brochure;Infant Driven Feeding Algorithm education  Discharge Pump: Personal WIC Program: No  Consult Status Consult Status: Follow-up Date: 01/04/21 Follow-up type: In-patient    Larita Deremer  Nicholson-Springer 01/03/2021, 8:49 PM

## 2021-01-03 NOTE — Lactation Note (Signed)
This note was copied from a baby's chart. Lactation Consultation Note  Patient Name: Tina Terry [id number] Date: 01/03/2021 Reason for consult: L&D Initial assessment;Primapara;1st time breastfeeding;Early term 37-38.6wks Age:23 hours  Assisted with first feeding in L&D.  Mom with semi-flat nipples.  LC sandwiched breast to assist with baby staying on breast .  Several attempts before baby able to attain a deep latch.    Mom would benefit from breast shells and a hand pump.  Mom to ask for help prn once on MBU.  Maternal Data Has patient been taught Hand Expression?: Yes Does the patient have breastfeeding experience prior to this delivery?: No  Feeding Mother's Current Feeding Choice: Breast Milk  LATCH Score Latch: Repeated attempts needed to sustain latch, nipple held in mouth throughout feeding, stimulation needed to elicit sucking reflex.  Audible Swallowing: A few with stimulation  Type of Nipple: Flat (compressible areola)  Comfort (Breast/Nipple): Soft / non-tender  Hold (Positioning): Assistance needed to correctly position infant at breast and maintain latch.  LATCH Score: 6   Interventions Interventions: Breast feeding basics reviewed;Assisted with latch;Skin to skin;Breast massage;Hand express;Reverse pressure;Breast compression;Adjust position;Support pillows;Position options  Consult Status Consult Status: Follow-up from L&D Date: 01/03/21 Follow-up type: In-patient    [name] 01/03/2021, 4:39 PM

## 2021-01-03 NOTE — Progress Notes (Signed)
Tina Terry is a 25 y.o. G1P0 at [redacted]w[redacted]d by LMP admitted for rupture of membranes  Subjective: Patient comfortable on RT lateral side after epidural. Patient's mother supportive at bedside.  Objective: BP 111/75    Pulse (!) 123    Temp 98.7 F (37.1 C) (Oral)    Resp 17    SpO2 99%  No intake/output data recorded. No intake/output data recorded.  FHT:  FHR: 130 bpm, variability: minimal ,  accelerations:  Abscent,  decelerations:  Absent UC:   regular, every 1-2 minutes SVE:   Dilation: 8 Effacement (%): 100 Station: -1 Exam by:: Raelyn Mora, CNM AROM forebag with small amount of clear fluid in return. Patient tolerated procedure well  Labs: Lab Results  Component Value Date   WBC 11.5 (H) 01/02/2021   HGB 10.6 (L) 01/02/2021   HCT 33.1 (L) 01/02/2021   MCV 90.9 01/02/2021   PLT 319 01/02/2021    Assessment / Plan: Spontaneous labor, progressing normally  Labor: Progressing normally Preeclampsia:   n/a Fetal Wellbeing:  Category I Pain Control:  Epidural I/D:  n/a Anticipated MOD:  NSVD  Raelyn Mora, CNM 01/03/2021, 1:17 PM

## 2021-01-03 NOTE — MAU Provider Note (Signed)
Pt informed that the ultrasound is considered a limited OB ultrasound and is not intended to be a complete ultrasound exam.  Patient also informed that the ultrasound is not being completed with the intent of assessing for fetal or placental anomalies or any pelvic abnormalities.  Explained that the purpose of todays ultrasound is to assess for  presentation (VTX).  Patient acknowledges the purpose of the exam and the limitations of the study.    Auburn Hert, Odie Sera, NP  2:10 AM 01/03/2021

## 2021-01-03 NOTE — H&P (Signed)
Tina Terry is a 25 y.o. female G1P0 with IUP at [redacted]w[redacted]d by LMP  presenting for SROM at 2030 clear fluid.  She reports positive fetal movement. She vaginal bleeding.  Prenatal History/Complications: PNC at [facility] Pregnancy complications:  - Past Medical History: Past Medical History:  Diagnosis Date   Hyperemesis gravidarum    Nausea and vomiting in adult    followed by [facility]. EGD 05/2019   Nephrolithiasis    Shingles     Past Surgical History: Past Surgical History:  Procedure Laterality Date   ESOPHAGOGASTRODUODENOSCOPY  06/06/2019    Obstetrical History: OB History     Gravida  1   Para      Term      Preterm      AB      Living         SAB      IAB      Ectopic      Multiple      Live Births               Social History: Social History   Socioeconomic History   Marital status: Single    Spouse name: Not on file   Number of children: Not on file   Years of education: Not on file   Highest education level: Not on file  Occupational History   Not on file  Tobacco Use   Smoking status: Never   Smokeless tobacco: Never  Vaping Use   Vaping Use: Never used  Substance and Sexual Activity   Alcohol use: Not Currently    Comment: occ   Drug use: Not Currently    Types: Marijuana   Sexual activity: Yes    Birth control/protection: None  Other Topics Concern   Not on file  Social History Narrative   Not on file   Social Determinants of Health   Financial Resource Strain: Not on file  Food Insecurity: Not on file  Transportation Needs: Not on file  Physical Activity: Not on file  Stress: Not on file  Social Connections: Not on file    Family History: Family History  Problem Relation Age of Onset   High Cholesterol Father     Allergies: Allergies  Allergen Reactions   Reglan [Metoclopramide] Anaphylaxis    Medications Prior to Admission  Medication Sig Dispense Refill Last Dose   aspirin 81 MG chewable tablet  Chew 1 tablet (81 mg total) by mouth daily. 30 tablet 5 Past Month   Prenatal Vit-Fe Fumarate-FA (PRENATAL COMPLETE) 14-0.4 MG TABS Take 1 tablet by mouth daily. 60 tablet 0 01/01/2021   docusate sodium (COLACE) 100 MG capsule Take 1 capsule (100 mg total) by mouth 2 (two) times daily as needed for mild constipation. (Patient not taking: Reported on 08/24/2020) 90 capsule 0    doxylamine, Sleep, (UNISOM) 25 MG tablet Take 1 tablet (25 mg total) by mouth 2 (two) times daily. (Patient not taking: Reported on 08/24/2020) 60 tablet 1    famotidine (PEPCID) 20 MG tablet Take 1 tablet (20 mg total) by mouth 2 (two) times daily. (Patient not taking: Reported on 08/24/2020) 60 tablet 3    Magnesium Oxide -Mg Supplement 400 MG CAPS Take 400 mg by mouth daily. 60 capsule 1    ondansetron (ZOFRAN) 4 MG tablet Take 1 tablet (4 mg total) by mouth every 8 (eight) hours. (Patient not taking: Reported on 08/24/2020) 60 tablet 1    polyethylene glycol (MIRALAX / GLYCOLAX)  17 g packet Take 17 g by mouth daily. (Patient not taking: Reported on 08/24/2020) 14 each 0    promethazine (PHENERGAN) 25 MG suppository Place 1 suppository (25 mg total) rectally every 6 (six) hours as needed for nausea or vomiting. (Patient not taking: Reported on 08/24/2020) 30 each 1    promethazine (PHENERGAN) 25 MG tablet Take 1 tablet (25 mg total) by mouth every 6 (six) hours as needed for nausea or vomiting. (Patient not taking: Reported on 08/24/2020) 30 tablet 2    scopolamine (TRANSDERM-SCOP) 1 MG/3DAYS Place 1 patch (1.5 mg total) onto the skin every 3 (three) days. (Patient not taking: Reported on 08/24/2020) 10 patch 0    pyridOXINE (VITAMIN B-6) 25 MG tablet Take 1 tablet (25 mg total) by mouth 2 (two) times daily. (Patient not taking: Reported on 08/24/2020) 60 tablet 1     Review of Systems   Constitutional: Negative for fever and chills Eyes: Negative for visual disturbances Respiratory: Negative for shortness of breath,  dyspnea Cardiovascular: Negative for chest pain or palpitations  Gastrointestinal: Negative for vomiting, diarrhea and constipation.  POSITIVE for abdominal pain (contractions) Genitourinary: Negative for dysuria and urgency Musculoskeletal: Negative for back pain, joint pain, myalgias  Neurological: Negative for dizziness and headaches  Blood pressure 133/87, pulse 82, temperature 98.9 F (37.2 C), temperature source Oral, resp. rate 16, SpO2 100 %. General appearance: alert, cooperative, and no distress Lungs: normal respiratory effort Heart: regular rate and rhythm Abdomen: soft, non-tender; bowel sounds normal Extremities: Homans sign is negative, no sign of DVT DTR's 2+ Presentation: cephalic Fetal monitoring  Baseline: 140 bpm, mod var, present acel, no decels, q 2-3 ctx Uterine activity       Prenatal labs: ABO, Rh: A/Positive/-- (08/09 2409) Antibody: Negative (08/09 0923) Rubella: 3.03 (08/09 0923) RPR: Non Reactive (10/04 0939)  HBsAg: Negative (08/09 0923)  HIV: Non Reactive (10/04 0939)  GBS: Negative/-- (12/13 1301)  1 hr Glucola passed Genetic screening  neg Anatomy US normal  Prenatal Transfer Tool  Maternal Diabetes: No Genetic Screening: Normal Maternal Ultrasounds/Referrals: Normal Fetal Ultrasounds or other Referrals:  Other:  Normal  Maternal Substance Abuse:  No Significant Maternal Medications:  Meds include: Other: baby ASA,  pNV, antiemetics Significant Maternal Lab Results: Group B Strep negative  Results for orders placed or performed during the hospital encounter of 01/02/21 (from the past 24 hour(s))  Protein / creatinine ratio, urine   Collection Time: 01/02/21 11:22 PM  Result Value Ref Range   Creatinine, Urine 148.53 mg/dL   Total Protein, Urine 17 mg/dL   Protein Creatinine Ratio 0.11 0.00 - 0.15 mg/mg[Cre]  Wet prep, genital   Collection Time: 01/02/21 11:31 PM  Result Value Ref Range   Yeast Wet Prep HPF POC NONE SEEN NONE SEEN    Trich, Wet Prep NONE SEEN NONE SEEN   Clue Cells Wet Prep HPF POC NONE SEEN NONE SEEN   WBC, Wet Prep HPF POC <10 <10   Sperm NONE SEEN   Amnisure rupture of membrane (rom)not at [facility]   Collection Time: 01/02/21 11:31 PM  Result Value Ref Range   Amnisure ROM POSITIVE   CBC with Differential/Platelet   Collection Time: 01/02/21 11:36 PM  Result Value Ref Range   WBC 11.5 (H) 4.0 - 10.5 K/uL   RBC 3.64 (L) 3.87 - 5.11 MIL/uL   Hemoglobin 10.6 (L) 12.0 - 15.0 g/dL   HCT 73.5 (L) 32.9 - 92.4 %   MCV 90.9 80.0 - 100.0 fL  MCH 29.1 26.0 - 34.0 pg   MCHC 32.0 30.0 - 36.0 g/dL   RDW 31.5 40.0 - 86.7 %   Platelets 319 150 - 400 K/uL   nRBC 0.0 0.0 - 0.2 %   Neutrophils Relative % 74 %   Neutro Abs 8.5 (H) 1.7 - 7.7 K/uL   Lymphocytes Relative 16 %   Lymphs Abs 1.8 0.7 - 4.0 K/uL   Monocytes Relative 8 %   Monocytes Absolute 0.9 0.1 - 1.0 K/uL   Eosinophils Relative 1 %   Eosinophils Absolute 0.2 0.0 - 0.5 K/uL   Basophils Relative 0 %   Basophils Absolute 0.0 0.0 - 0.1 K/uL   Immature Granulocytes 1 %   Abs Immature Granulocytes 0.09 (H) 0.00 - 0.07 K/uL  Comprehensive metabolic panel   Collection Time: 01/02/21 11:36 PM  Result Value Ref Range   Sodium 135 135 - 145 mmol/L   Potassium 4.8 3.5 - 5.1 mmol/L   Chloride 105 98 - 111 mmol/L   CO2 24 22 - 32 mmol/L   Glucose, Bld 100 (H) 70 - 99 mg/dL   BUN 7 6 - 20 mg/dL   Creatinine, Ser 6.19 0.44 - 1.00 mg/dL   Calcium 9.2 8.9 - 50.9 mg/dL   Total Protein 6.0 (L) 6.5 - 8.1 g/dL   Albumin 2.9 (L) 3.5 - 5.0 g/dL   AST 22 15 - 41 U/L   ALT 22 0 - 44 U/L   Alkaline Phosphatase 161 (H) 38 - 126 U/L   Total Bilirubin 0.5 0.3 - 1.2 mg/dL   GFR, Estimated >32 >67 mL/min   Anion gap 6 5 - 15  Resp Panel by RT-PCR (Flu A&B, Covid) Nasopharyngeal Swab   Collection Time: 01/03/21 12:53 AM   Specimen: Nasopharyngeal Swab; Nasopharyngeal(NP) swabs in vial transport medium  Result Value Ref Range   SARS Coronavirus 2 by RT PCR  NEGATIVE NEGATIVE   Influenza A by PCR NEGATIVE NEGATIVE   Influenza B by PCR NEGATIVE NEGATIVE    Assessment: Tina Terry is a 25 y.o. G1P0 with an IUP at [redacted]w[redacted]d presenting for SROM at 2030.  Still long and thick, will start buccal cytotec Plan: #Labor: expectant management #Pain:  Per request #FWB Cat 1 #ID: GBS: breast #MOF:  breast #MOC: unsure #Circ: yes    Charlesetta Garibaldi Eryanna Regal 01/03/2021, 2:15 AM

## 2021-01-03 NOTE — MAU Note (Signed)
LR hung at bolus rate

## 2021-01-03 NOTE — Anesthesia Preprocedure Evaluation (Signed)
Anesthesia Evaluation  Patient identified by MRN, date of birth, ID band Patient awake    Reviewed: Allergy & Precautions, NPO status , Patient's Chart, lab work & pertinent test results  Airway Mallampati: II  TM Distance: >3 FB Neck ROM: Full    Dental no notable dental hx. (+) Teeth Intact, Dental Advisory Given   Pulmonary neg pulmonary ROS,    Pulmonary exam normal breath sounds clear to auscultation       Cardiovascular negative cardio ROS Normal cardiovascular exam Rhythm:Regular Rate:Normal     Neuro/Psych negative neurological ROS  negative psych ROS   GI/Hepatic negative GI ROS, Neg liver ROS,   Endo/Other  negative endocrine ROS  Renal/GU negative Renal ROS  negative genitourinary   Musculoskeletal negative musculoskeletal ROS (+)   Abdominal   Peds  Hematology negative hematology ROS (+)   Anesthesia Other Findings Presents with SROM  Reproductive/Obstetrics (+) Pregnancy                             Anesthesia Physical Anesthesia Plan  ASA: 2  Anesthesia Plan: Epidural   Post-op Pain Management:    Induction:   PONV Risk Score and Plan: Treatment may vary due to age or medical condition  Airway Management Planned: Natural Airway  Additional Equipment:   Intra-op Plan:   Post-operative Plan:   Informed Consent: I have reviewed the patients History and Physical, chart, labs and discussed the procedure including the risks, benefits and alternatives for the proposed anesthesia with the patient or authorized representative who has indicated his/her understanding and acceptance.       Plan Discussed with: Anesthesiologist  Anesthesia Plan Comments: (Patient identified. Risks, benefits, options discussed with patient including but not limited to bleeding, infection, nerve damage, paralysis, failed block, incomplete pain control, headache, blood pressure changes,  nausea, vomiting, reactions to medication, itching, and post partum back pain. Confirmed with bedside nurse the patient's most recent platelet count. Confirmed with the patient that they are not taking any anticoagulation, have any bleeding history or any family history of bleeding disorders. Patient expressed understanding and wishes to proceed. All questions were answered. )        Anesthesia Quick Evaluation

## 2021-01-04 ENCOUNTER — Encounter: Payer: Medicaid Other | Admitting: Advanced Practice Midwife

## 2021-01-04 NOTE — Progress Notes (Signed)
Tina Terry  Post Partum Day One:S/P SVD  Subjective: Patient up ad lib, denies syncope or dizziness. Reports consuming regular diet without issues and denies N/V. Denies issues with urination and reports bleeding is "coming out everytime I change position."  Patient is breastfeeding and reports going well, but supplementation is required.  Desires no method for postpartum contraception.  Pain is being appropriately managed with use of tylenol and motrin.  Objective: Vitals:   01/03/21 1835 01/03/21 2250 01/04/21 0220 01/04/21 0625  BP: 124/82 105/72 114/70 101/62  Pulse: 87 72 84 81  Resp: 16 17 18 17   Temp: 98.2 F (36.8 C) 97.9 F (36.6 C) 97.9 F (36.6 C) 97.9 F (36.6 C)  TempSrc: Oral Oral Oral Oral  SpO2:  98% 99% 98%   Recent Labs    01/02/21 2336  HGB 10.6*  HCT 33.1*    Physical Exam:  General: alert, cooperative, and no distress Mood/Affect: Appropriate/Appropriate Lungs: No distress noted.  Heart: normal rate and regular rhythm. Breast: lactating, no erythema or tenderness, nipples normal. Abdomen:  + bowel sounds, Soft, NT Uterine Fundus: firm at umbilicus Lochia: appropriate Laceration: None Skin: Warm, Dry DVT Evaluation: No evidence of DVT seen on physical exam. No significant calf/ankle edema.  Assessment S/P Vaginal Delivery-Day One Normal Involution BreastFeeding Desires Infant Circumcision  Plan: -Bleeding precautions given. -Informed that discharge contingent upon infant status and discharge. -Discharge education provided. -Instructed to call primary ob and schedule PPV. -Circumcision procedure discussed. -Risks and benefits of procedure were reviewed including, but not limited to:  *Benefits include reduction in the rates of urinary tract infection (UTI), penile cancer, some sexually transmitted infections, penile inflammatory, and retractile disorders, as well as easier hygiene.   *Risks include bleeding, infection, injury of glans which  may lead to need for additional surgery, penile deformity, or urinary tract issues, unsatisfactory cosmetic appearance and other potential complications related to the procedure.   -Informed that procedure will not be performed if provider deems inappropriate d/t penile size, noted deformity, or unsatisfactory pediatric evaluation. -It was emphasized that this is an elective procedure.   -Patient wants to proceed with circumcision. -Circumcision to be done pending pediatric evaluation of infant.  -Post circumcision care discussed. -L&D team to be updated on patient status   01/04/21, MSN, CNM 01/04/2021, 7:13 AM

## 2021-01-04 NOTE — Anesthesia Postprocedure Evaluation (Signed)
Anesthesia Post Note  Patient: Tina Terry  Procedure(s) Performed: AN AD HOC LABOR EPIDURAL     Patient location during evaluation: Mother Baby Anesthesia Type: Epidural Level of consciousness: awake and alert and oriented Pain management: satisfactory to patient Vital Signs Assessment: post-procedure vital signs reviewed and stable Respiratory status: respiratory function stable Cardiovascular status: stable Postop Assessment: no headache, no backache, epidural receding, patient able to bend at knees, no signs of nausea or vomiting, adequate PO intake and able to ambulate Anesthetic complications: no   No notable events documented.  Last Vitals:  Vitals:   01/04/21 0220 01/04/21 0625  BP: 114/70 101/62  Pulse: 84 81  Resp: 18 17  Temp: 36.6 C 36.6 C  SpO2: 99% 98%    Last Pain:  Vitals:   01/04/21 0625  TempSrc: Oral  PainSc: 6    Pain Goal:                   Traivon Morrical

## 2021-01-04 NOTE — Clinical Social Work Maternal (Signed)
CLINICAL SOCIAL WORK MATERNAL/CHILD NOTE  Patient Details  Name: Tina Terry MRN: [id number] Date of Birth: March 18, 1995  Date:  01/04/2021  Clinical Social Worker Initiating Note:  Darra Lis, [place] Date/Time: Initiated:  01/04/21/1000     Child's Name:  Tina Terry   Biological Parents:  Mother, Father Tina Terry 03/02/1999)   Need for Interpreter:  None   Reason for Referral:  Current Substance Use/Substance Use During Pregnancy     Address:  [address] [place] [address]    Phone number:  [phone] (home)     Additional phone number:   Household Members/Support Persons (HM/SP):   Household Member/Support Person 1, Household Member/Support Person 2, Household Member/Support Person 3, Household Member/Support Person 4   HM/SP Name Relationship DOB or Age  HM/SP -1 Tina Terry Significant Other 03/02/1999  HM/SP -2 Tina Terry PGF 27  HM/SP -3 Tina Terry PGM 20  HM/SP -[place] [employer] Sister 26  HM/SP -5        HM/SP -6        HM/SP -7        HM/SP -8          Natural Supports (not living in the home):  Parent, Immediate Family, Extended Family   Professional Supports: None   Employment: Animator   Type of Work: Designer, industrial/product   Education:  [place] arranged:    Museum/gallery curator Resources:  Multimedia programmer    Other Resources:  Physicist, medical     Cultural/Religious Considerations Which May Impact Care:    Strengths:  Ability to meet basic needs  , Home prepared for child  , Pediatrician chosen   Psychotropic Medications:         Pediatrician:    Careers adviser area  Pediatrician List:   [employer] Other (Orlene Och, MD)  [place]      Pediatrician Fax Number:    Risk Factors/Current Problems:  None   Cognitive State:  Insightful  , Linear Thinking  , Alert  , Goal Oriented     Mood/Affect:  Interested  , Happy  ,  Bright     CSW Assessment: CSW consulted for [facility] use. CSW met with MOB to assess and offer support. CSW introduced self and role. CSW observed infant in bassinet and FOB bedside. Parent were pleasant and engage throughout assessment. CSW informed MOB of reason for consult and assessed feelings. MOB expressed she is currently doing well. MOB is employed full time and receives food stamp resources. MOB expressed interest in [facility] and was provided with information. MOB reports no mental health history. MOB denies any SI or HI. MOB expressed she has a strong support system consisting of FOB, parents and sisters.    MOB reported she has not used THC since May. MOB denies any additional substance use. CSW informed MOB of the hospital drug screen policy. MOB aware that a CPS report will be made if infant test positive for substances. Parents were understanding and denied any questions.   CSW provided education regarding the baby blues period versus PPD and provided resources. CSW provided the New Mom Checklist and encouraged MOB to self evaluate and contact a medical professional if symptoms are noted at any time.   CSW provided review of Sudden Infant Death Syndrome (SIDS) precautions. MOB stated she has all infant needs including a  bassinet and crib. MOB declined any additional resources at this time.    CSW will continue to follow UDS/CDS and make a CPS report if warranted. CSW identifies no further need for intervention and no barriers to discharge at this time.  CSW Plan/Description:  No Further Intervention Required/No Barriers to Discharge, CSW Will Continue to Monitor Umbilical Cord Tissue Drug Screen Results and Make Report if Warranted, Child Protective Service Report  , Hospital Drug Screen Policy Information, Perinatal Mood and Anxiety Disorder (PMADs) Education, Other Information/Referral to [employer], Sudden Infant Death Syndrome (SIDS) Education    Tina Terry, [place] 01/04/2021,  10:35 AM

## 2021-01-05 MED ORDER — ACETAMINOPHEN 325 MG PO TABS
650.0000 mg | ORAL_TABLET | ORAL | 0 refills | Status: AC | PRN
Start: 2021-01-05 — End: ?

## 2021-01-05 MED ORDER — IBUPROFEN 600 MG PO TABS
600.0000 mg | ORAL_TABLET | Freq: Four times a day (QID) | ORAL | 0 refills | Status: AC
Start: 2021-01-05 — End: ?

## 2021-01-05 MED ORDER — PROPOFOL 1000 MG/100ML IV EMUL
INTRAVENOUS | Status: AC
  Filled 2021-01-05: qty 100

## 2021-01-05 NOTE — Lactation Note (Signed)
This note was copied from a baby's chart. Lactation Consultation Note  Patient Name: Tina Terry Date: 01/05/2021 Reason for consult: Follow-up assessment;Early term 37-38.6wks;Infant < 6lbs;Breastfeeding assistance;Infant weight loss;Other (Comment) (6% weight loss / per mom the nurse started the NS and the baby is latching better/ milk in the NS after the baby finishes.) Age:23 hours LC reviewed BF D/C teaching and LC plan for a baby less than 6 pounds / using a NS .  Shells between feedings except when sleeping if needed to make the areola more compressible.  Feed with feeding cues / or by 3 hours due to ET / less than 6 pounds  Feed 15 -20 mins mins 30 mins max , supplement starting with 20 ml and increase to 30 ml ( EBM - now that the milk is in). 8-12 feedings a day.  Post pump both breast for 15 mins save milk for the next feeding.  Mom aware of LC resources after D/C.   Maternal Data    Feeding Mother's Current Feeding Choice: Breast Milk  LATCH Score - baby recently fed unable to assess a latch    Lactation Tools Discussed/Used Tools: Shells;Nipple Shields Nipple shield size: 20;Other (comment) (per mom was started by the nurse) Flange Size: 21;24 Breast pump type: Double-Electric Breast Pump Pump Education: Milk Storage  Interventions Interventions: Breast feeding basics reviewed;Shells;DEBP;Education;LC Services brochure  Discharge - reviewed sore nipple and engorgement prevention and tx.  S/S of mastitis and signs of illness for baby to call.  Per mom will be going for [facility] Friday with [facility], at [facility].    Consult Status Consult Status: Complete Date: 01/05/21    Kathrin Greathouse 01/05/2021, 10:45 AM

## 2021-01-05 NOTE — Lactation Note (Signed)
This note was copied from a baby's chart. Lactation Consultation Note Mom stated she had just finished BF for 30 minutes. \ Mom was getting ready for pumping. Mom stated she didn't have any questions. Encouraged mom to increase supplement amount and limit BF time to max. 20-25 minutes.  Patient Name: Tina Terry [id number] Date: 01/05/2021 Reason for consult: Follow-up assessment;Early term 37-38.6wks;Infant < 6lbs;[place] Age:23 hours  Maternal Data    Feeding Nipple Type: Slow - flow  LATCH Score                    Lactation Tools Discussed/Used Tools: Pump Breast pump type: Double-Electric Breast Pump Reason for Pumping: NS/less than 6 lbs/37 wks  Interventions    Discharge    Consult Status Consult Status: Follow-up Date: 01/06/21 Follow-up type: In-patient    Charyl Dancer 01/05/2021, 2:37 AM

## 2021-01-13 ENCOUNTER — Encounter: Payer: Medicaid Other | Admitting: Family Medicine

## 2021-01-14 ENCOUNTER — Telehealth (HOSPITAL_COMMUNITY): Payer: Self-pay

## 2021-01-14 NOTE — Telephone Encounter (Signed)
No answer. Mailbox is full.   Tina Terry Tina Terry [facility] 01/14/2021,1119

## 2021-02-01 ENCOUNTER — Ambulatory Visit (INDEPENDENT_AMBULATORY_CARE_PROVIDER_SITE_OTHER): Payer: BC Managed Care – PPO | Admitting: Advanced Practice Midwife

## 2021-02-01 ENCOUNTER — Encounter: Payer: Self-pay | Admitting: Advanced Practice Midwife

## 2021-02-01 ENCOUNTER — Other Ambulatory Visit: Payer: Self-pay

## 2021-02-01 NOTE — Progress Notes (Signed)
° ° °  Post Partum Visit Note  Tina Terry is a 26 y.o. G109P1001 female who presents for a postpartum visit. She is 4 weeks postpartum following a normal spontaneous vaginal delivery.  I have fully reviewed the prenatal and intrapartum course. The delivery was at 37.1 gestational weeks.  Anesthesia: epidural. Postpartum course has been uncomplicated. Baby is doing well. Baby is feeding by breast. Bleeding no bleeding. Bowel function is normal. Bladder function is normal. Patient is sexually active. Contraception method is condoms. Postpartum depression screening: negative.   The pregnancy intention screening data noted above was reviewed. Potential methods of contraception were discussed. The patient elected to proceed with No data recorded.    Health Maintenance Due  Topic Date Due   HPV VACCINES (1 - 2-dose series) Never done    The following portions of the patient's history were reviewed and updated as appropriate: allergies, current medications, past family history, past medical history, past social history, past surgical history, and problem list.  Review of Systems Pertinent items noted in HPI and remainder of comprehensive ROS otherwise negative.  Objective:  There were no vitals taken for this visit.   General:  alert, cooperative, and no distress   Breasts:  normal  Lungs: clear to auscultation bilaterally  Heart:  regular rate and rhythm, S1, S2 normal, no murmur, click, rub or gallop  Abdomen: soft, non-tender; bowel sounds normal; no masses,  no organomegaly   Wound N/a  GU exam:  deferred       Assessment:    Normal postpartum exam.   Plan:   Essential components of care per ACOG recommendations:  1.  Mood and well being: Patient with negative depression screening today. Reviewed local resources for support.  - Patient tobacco use? No.   - hx of drug use? No.    2. Infant care and feeding:  -Patient currently breastmilk feeding? Yes. Reviewed importance of  draining breast regularly to support lactation.  -Social determinants of health (SDOH) reviewed in EPIC. No concerns  3. Sexuality, contraception and birth spacing - Patient does not want a pregnancy in the next year.   - Reviewed forms of contraception in tiered fashion. Patient desired condoms today.   - Discussed birth spacing of 18 months  4. Sleep and fatigue -Encouraged family/partner/community support of 4 hrs of uninterrupted sleep to help with mood and fatigue  5. Physical Recovery  - Discussed patients delivery and complications. She describes her labor as good. - Patient had a Vaginal, no problems at delivery. Patient had  no  laceration. Perineal healing reviewed. Patient expressed understanding - Patient has urinary incontinence? No. - Patient is safe to resume physical and sexual activity  6.  Health Maintenance - HM due items addressed No -   - Last pap smear  Diagnosis  Date Value Ref Range Status  08/24/2020   Final   - Negative for intraepithelial lesion or malignancy (NILM)   Pap smear not done at today's visit.  -Breast Cancer screening indicated? No.   7. Chronic Disease/Pregnancy Condition follow up: None  - PCP follow up  Armandina Stammer, RN [facility] for [employer], [facility]   Aviva Signs, [place]

## 2021-11-30 ENCOUNTER — Encounter: Payer: Self-pay | Admitting: General Practice

## 2022-01-19 IMAGING — US US MFM OB COMP +14 WKS
1 series · 13 of 28 positions shown · non-contrast
Comparison: none

[Series 1: us mfm ob comp +14 wks · 102 acquisitions, 13 frames shown]
[im 4/102]
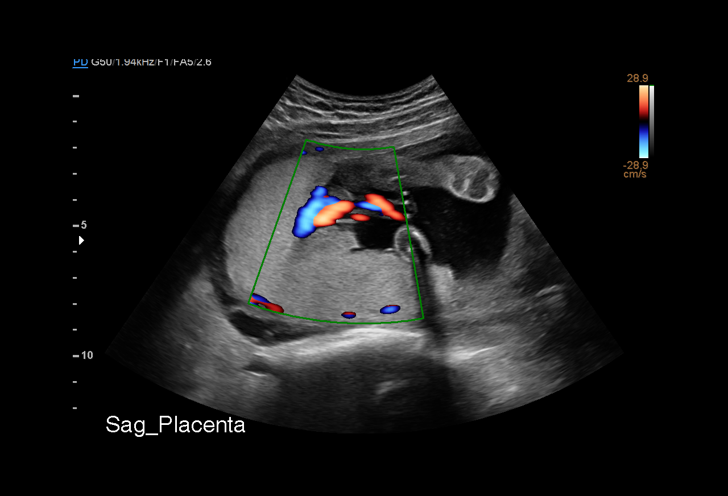
[im 12/102]
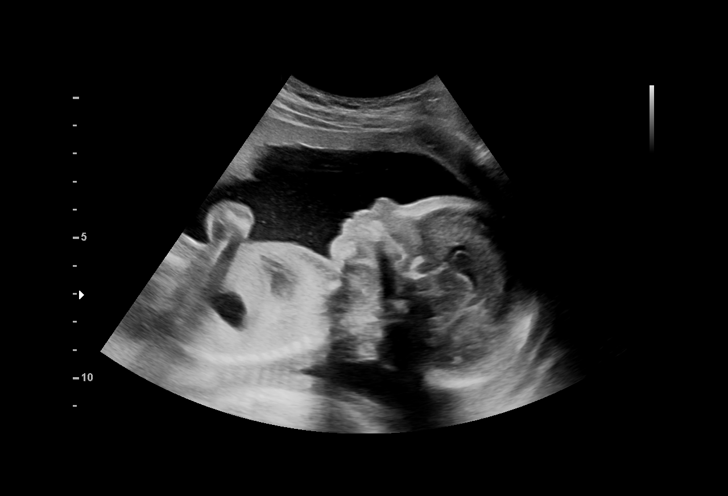
[im 19/102]
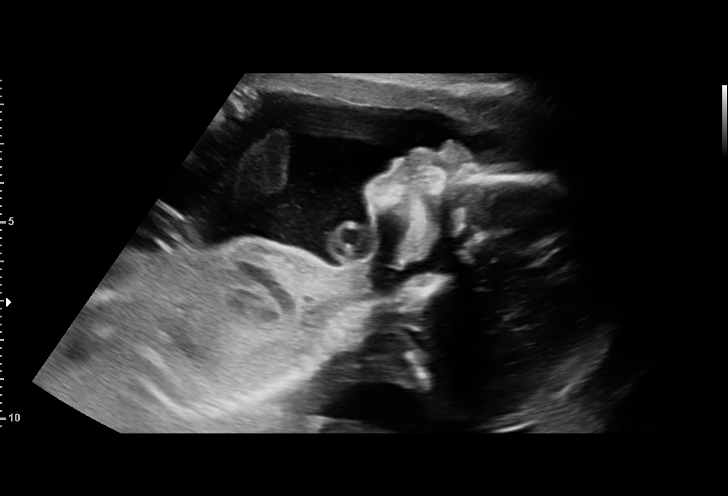
[im 27/102]
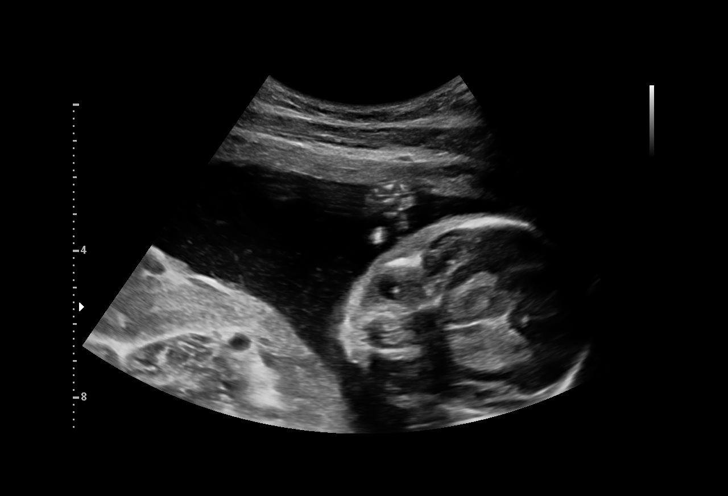
[im 34/102]
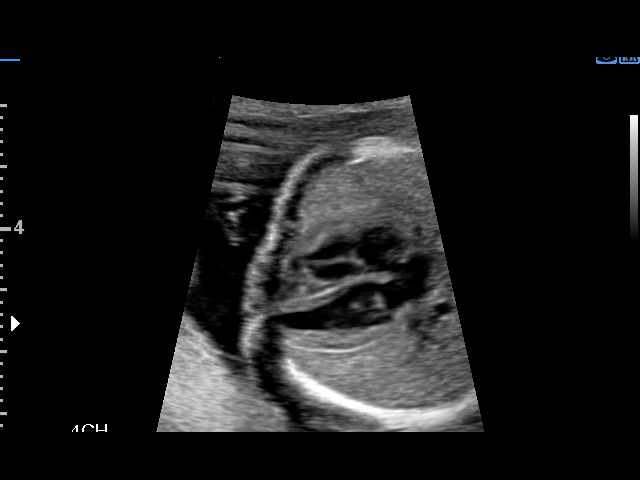
[im 42/102]
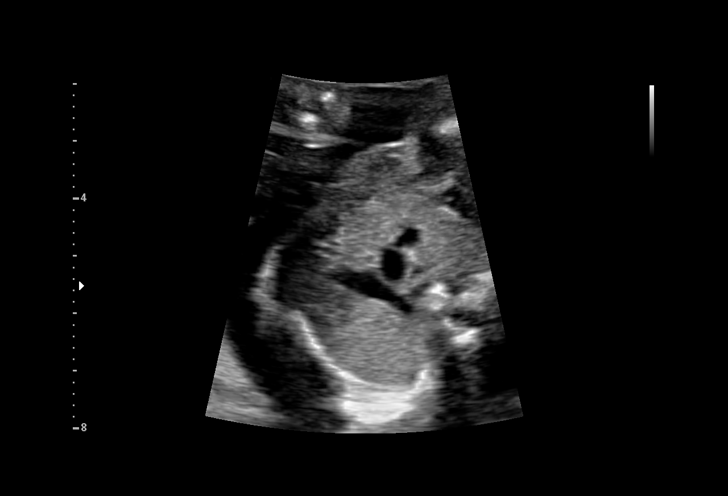
[im 53/102]
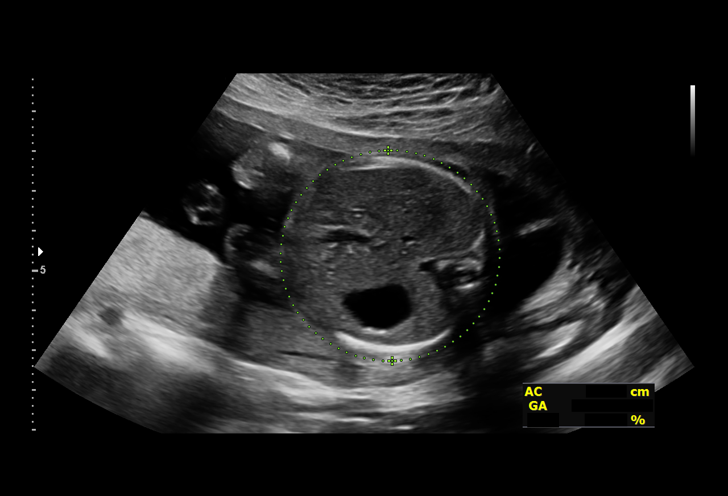
[im 60/102]
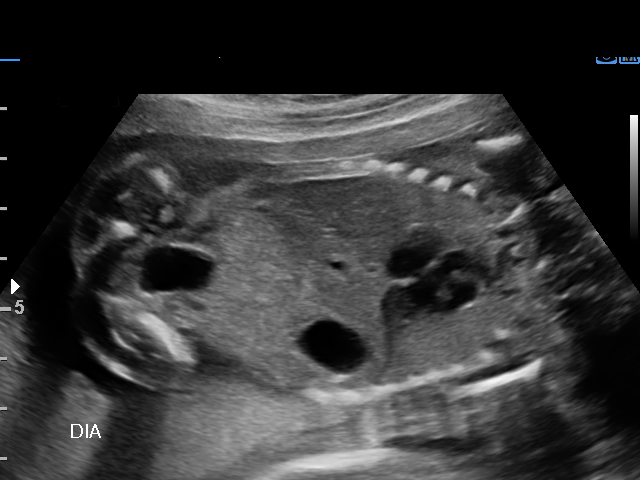
[im 68/102]
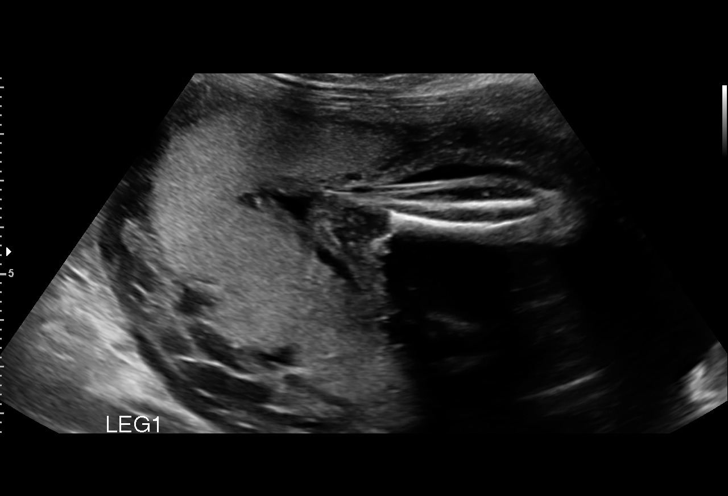
[im 75/102]
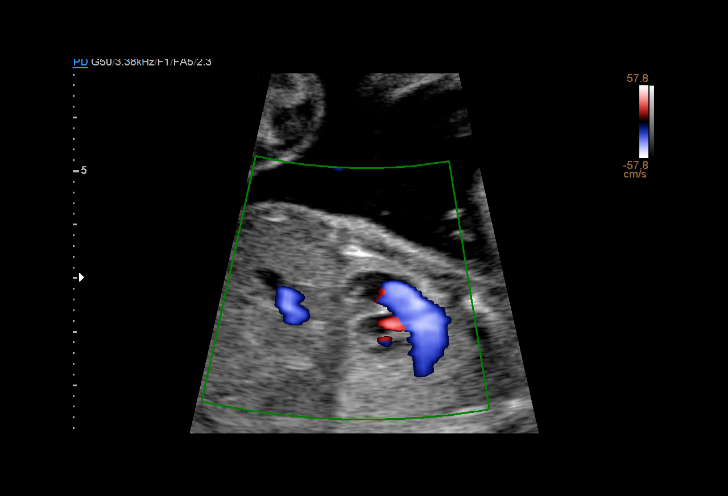
[im 83/102]
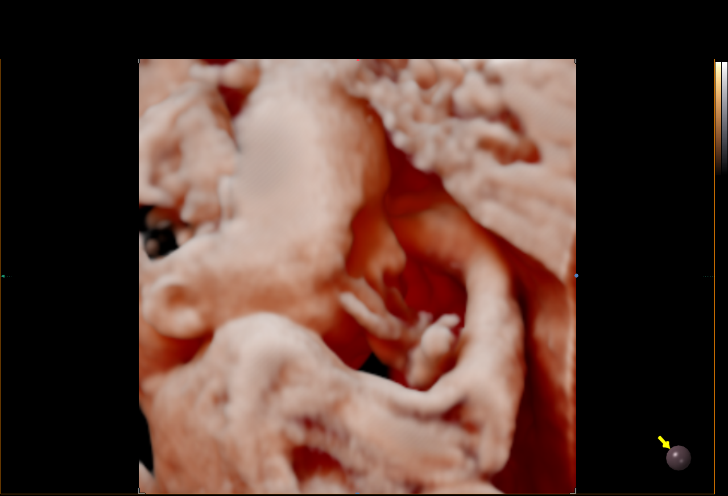
[im 90/102]
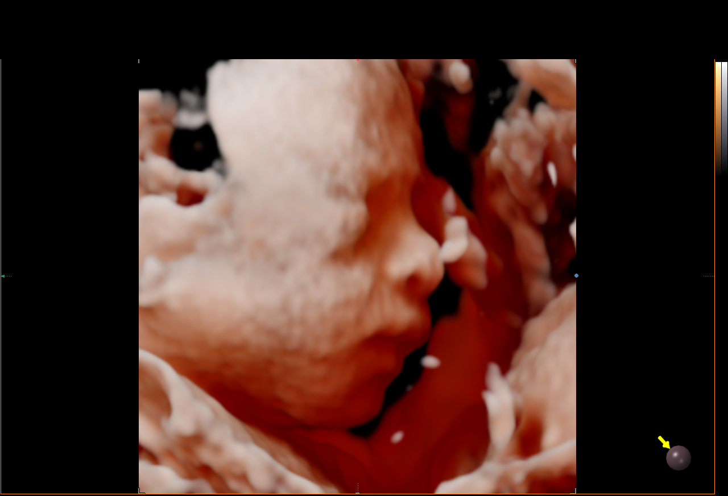
[im 98/102]
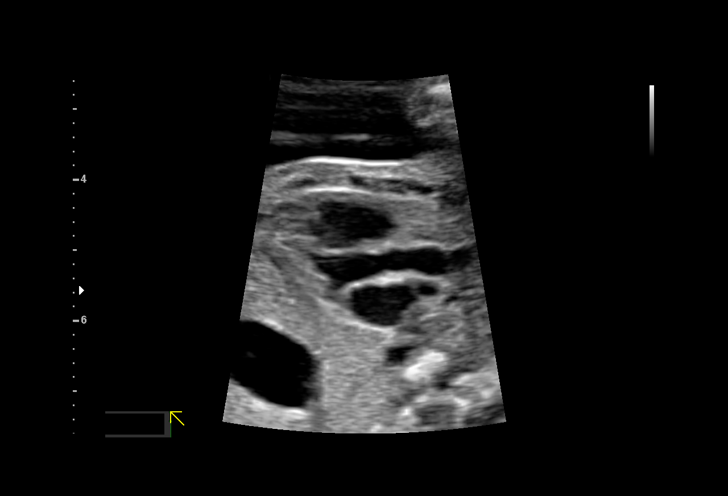

[13 of 28 positions shown; findings below may reference images not displayed]

[REDACTED]
                   MOLDEN CNM

Indications

 21 weeks gestation of pregnancy
 Encounter for antenatal screening for
 malformations
Fetal Evaluation

 Num Of Fetuses:         1
 Fetal Heart Rate(bpm):  153
 Cardiac Activity:       Observed
 Presentation:           Cephalic
 Placenta:               Posterior
 P. Cord Insertion:      Visualized, central

 Amniotic Fluid
 AFI FV:      Within normal limits

                             Largest Pocket(cm)

Biometry

 BPD:      53.6  mm     G. Age:  22w 2d         76  %    CI:        78.39   %    70 - 86
                                                         FL/HC:      19.0   %    18.4 -
 HC:      191.5  mm     G. Age:  21w 3d         32  %    HC/AC:      1.12        1.06 -
 AC:      171.6  mm     G. Age:  22w 1d         61  %    FL/BPD:     67.7   %    71 - 87
 FL:       36.3  mm     G. Age:  21w 4d         38  %    FL/AC:      21.2   %    20 - 24
 HUM:      35.3  mm     G. Age:  22w 1d         63  %
 CER:      23.9  mm     G. Age:  22w 0d         80  %
 LV:        5.6  mm
 CM:        4.9  mm

 Est. FW:     454  gm           1 lb     58  %
OB History

 Gravidity:    1
 Living:       0
Gestational Age

 LMP:           24w 2d        Date:  03/30/20                 EDD:   01/04/21
 Clinical EDD:  21w 4d                                        EDD:   01/23/21
 U/S Today:     21w 6d                                        EDD:   01/21/21
 Best:          21w 4d     Det. By:  Clinical EDD             EDD:   01/23/21
Anatomy

 Cranium:               Appears normal         LVOT:                   Appears normal
 Cavum:                 Appears normal         Aortic Arch:            Appears normal
 Ventricles:            Appears normal         Ductal Arch:            Appears normal
 Choroid Plexus:        Appears normal         Diaphragm:              Appears normal
 Cerebellum:            Appears normal         Stomach:                Appears normal, left
                                                                       sided
 Posterior Fossa:       Appears normal         Abdomen:                Appears normal
 Nuchal Fold:           Not applicable (>20    Abdominal Wall:         Appears nml (cord
                        wks GA)                                        insert, abd wall)
 Face:                  Appears normal         Cord Vessels:           Appears normal (3
                        (orbits and profile)                           vessel cord)
 Lips:                  Appears normal         Kidneys:                Appear normal
 Palate:                Appears normal         Bladder:                Appears normal
 Thoracic:              Appears normal         Spine:                  Appears normal
 Heart:                 Appears normal         Upper Extremities:      Appears normal
                        (4CH, axis, and
                        situs)
 RVOT:                  Appears normal         Lower Extremities:      Appears normal

 Other:  Heels and 5th digit visualized
Targeted Anatomy

 Thorax
 SVC:                   Appears normal         3 V Trachea View:       Appears normal
 3 Vessel View:         Appears normal         IVC:                    Appears normal

 Other
 Genitalia:             Normal Male
Cervix Uterus Adnexa

 Cervix
 Length:           4.33  cm.
 Normal appearance by transabdominal scan.

 Right Ovary
 Visualized.
 Left Ovary
 Visualized.
Impression

 G1 P0. Patient is here for fetal anatomy scan.

 On cell-free fetal DNA screening, the risks of fetal
 aneuploidies are not increased (seen her "MyChart" on her
 phone).

 We performed fetal anatomy scan. No makers of
 aneuploidies or fetal structural defects are seen. Fetal
 biometry is consistent with her previously-established dates.
 Amniotic fluid is normal and good fetal activity is seen.
 Patient understands the limitations of ultrasound in detecting
 fetal anomalies.
Recommendations

 Follow-up scans as clinically indicated.
                 Zhong, Torrie
